# Patient Record
Sex: Female | Born: 1965 | Race: White | Hispanic: No | Marital: Single | Smoking: Current every day smoker
Health system: Southern US, Community
[De-identification: ages and names within clinical notes are randomized; demographics above are authoritative.]

## PROBLEM LIST (undated history)

## (undated) DIAGNOSIS — F32A Depression, unspecified: Secondary | ICD-10-CM

## (undated) DIAGNOSIS — E785 Hyperlipidemia, unspecified: Secondary | ICD-10-CM

## (undated) DIAGNOSIS — F411 Generalized anxiety disorder: Secondary | ICD-10-CM

## (undated) DIAGNOSIS — F329 Major depressive disorder, single episode, unspecified: Secondary | ICD-10-CM

## (undated) DIAGNOSIS — I10 Essential (primary) hypertension: Secondary | ICD-10-CM

## (undated) HISTORY — DX: Depression, unspecified: F32.A

## (undated) HISTORY — DX: Major depressive disorder, single episode, unspecified: F32.9

## (undated) HISTORY — DX: Generalized anxiety disorder: F41.1

## (undated) HISTORY — DX: Hyperlipidemia, unspecified: E78.5

## (undated) HISTORY — DX: Essential (primary) hypertension: I10

## (undated) HISTORY — PX: LEG SURGERY: SHX1003

---

## 2005-07-03 ENCOUNTER — Other Ambulatory Visit: Admission: RE | Admit: 2005-07-03 | Discharge: 2005-07-03 | Payer: Self-pay | Admitting: Family Medicine

## 2012-08-18 ENCOUNTER — Encounter: Payer: Self-pay | Admitting: Nurse Practitioner

## 2012-08-18 ENCOUNTER — Ambulatory Visit (INDEPENDENT_AMBULATORY_CARE_PROVIDER_SITE_OTHER): Payer: BC Managed Care – PPO | Admitting: Nurse Practitioner

## 2012-08-18 VITALS — BP 145/87 | HR 69 | Temp 98.2°F | Ht 63.0 in | Wt 139.0 lb

## 2012-08-18 DIAGNOSIS — Z13220 Encounter for screening for lipoid disorders: Secondary | ICD-10-CM

## 2012-08-18 DIAGNOSIS — M542 Cervicalgia: Secondary | ICD-10-CM

## 2012-08-18 DIAGNOSIS — F329 Major depressive disorder, single episode, unspecified: Secondary | ICD-10-CM

## 2012-08-18 DIAGNOSIS — F32A Depression, unspecified: Secondary | ICD-10-CM | POA: Insufficient documentation

## 2012-08-18 MED ORDER — CITALOPRAM HYDROBROMIDE 20 MG PO TABS: 20.0000 mg | ORAL_TABLET | Freq: Every day | ORAL | Status: DC

## 2012-08-18 MED ORDER — CYCLOBENZAPRINE HCL 10 MG PO TABS: 10.0000 mg | ORAL_TABLET | Freq: Three times a day (TID) | ORAL | Status: DC | PRN

## 2012-08-18 NOTE — Progress Notes (Signed)
  Subjective:    Patient ID: Maria West, female    DOB: 1965-12-18, 47 y.o.   MRN: [id number]  HPI Patient here to day for follow-up of depression. She has been on celexa 20mg  QD and states that she is doing well. If forgets to take it she can tell a difference. No c/o side effects from medication.  8 Patient had some neck pain last week. Flares up occasionally. Uses muscle relaxer's when flares up. Is currently out of meds.   Review of Systems  Constitutional: Negative.   HENT: Negative.   Respiratory: Negative.   Cardiovascular: Negative.   Musculoskeletal: Positive for myalgias (neck intermittently).  Psychiatric/Behavioral: Negative.        Objective:   Physical Exam  Constitutional: She appears well-developed and well-nourished.  Cardiovascular: Normal rate, regular rhythm and normal heart sounds.   Pulmonary/Chest: Effort normal and breath sounds normal.  Musculoskeletal:  FROM of neck without  Motor strength and sensation bil upper ext intact Grips =Bil  Psychiatric: She has a normal mood and affect. Her behavior is normal. Judgment and thought content normal.   BP 145/87  Pulse 69  Temp(Src) 98.2 F (36.8 C) (Oral)  Ht 5\' 3"  (1.6 m)  Wt 139 lb (63.05 kg)  BMI 24.63 kg/m2  LMP 08/18/2009        Assessment & Plan:  1. Depression Stress management exercise - citalopram (CELEXA) 20 MG tablet; Take 1 tablet (20 mg total) by mouth daily.  Dispense: 90 tablet; Refill: 1  2. Neck pain, bilateral Neck stretches q AM Moist heat Get new pillow - cyclobenzaprine (FLEXERIL) 10 MG tablet; Take 1 tablet (10 mg total) by mouth 3 (three) times daily as needed for muscle spasms.  Dispense: 30 tablet; Refill: 0 - COMPLETE METABOLIC PANEL WITH GFR  3. Screening cholesterol level Low fat diet and execise - NMR Lipoprofile with Lipids  Mary-Margaret Daphine Deutscher, FNP

## 2012-08-18 NOTE — Patient Instructions (Signed)
Moist heat to neck New pillow meds as rx

## 2012-08-19 LAB — NMR LIPOPROFILE WITH LIPIDS
Cholesterol, Total: 256 mg/dL — ABNORMAL HIGH (ref <200)
HDL Size: 9.5 nm (ref >=9.2)
Large HDL-P: 9.5 umol/L (ref >=4.8)
Large VLDL-P: 4.7 nmol/L — ABNORMAL HIGH (ref <=2.7)
Small LDL Particle Number: 630 nmol/L — ABNORMAL HIGH (ref <=527)
Triglycerides: 118 mg/dL (ref <150)

## 2012-08-19 LAB — COMPLETE METABOLIC PANEL WITH GFR
Albumin: 4.7 g/dL (ref 3.5–5.2)
Alkaline Phosphatase: 60 U/L (ref 39–117)
BUN: 11 mg/dL (ref 6–23)
Calcium: 10.1 mg/dL (ref 8.4–10.5)
Chloride: 103 mEq/L (ref 96–112)
GFR, Est Non African American: >89 mL/min
Glucose, Bld: 78 mg/dL (ref 70–99)
Potassium: 4.3 mEq/L (ref 3.5–5.3)
Sodium: 138 mEq/L (ref 135–145)
Total Protein: 6.9 g/dL (ref 6.0–8.3)

## 2012-08-20 ENCOUNTER — Telehealth: Payer: Self-pay | Admitting: Nurse Practitioner

## 2012-08-20 NOTE — Telephone Encounter (Signed)
Patient aware of labs.  

## 2012-09-30 ENCOUNTER — Ambulatory Visit (INDEPENDENT_AMBULATORY_CARE_PROVIDER_SITE_OTHER): Payer: BC Managed Care – PPO | Admitting: Family Medicine

## 2012-09-30 ENCOUNTER — Encounter: Payer: Self-pay | Admitting: Family Medicine

## 2012-09-30 ENCOUNTER — Ambulatory Visit (INDEPENDENT_AMBULATORY_CARE_PROVIDER_SITE_OTHER): Payer: BC Managed Care – PPO

## 2012-09-30 VITALS — BP 142/85 | HR 61 | Temp 97.6°F | Wt 135.8 lb

## 2012-09-30 DIAGNOSIS — M5412 Radiculopathy, cervical region: Secondary | ICD-10-CM | POA: Insufficient documentation

## 2012-09-30 MED ORDER — PREDNISONE 20 MG PO TABS: 40.0000 mg | ORAL_TABLET | Freq: Every day | ORAL | Status: DC

## 2012-09-30 NOTE — Progress Notes (Signed)
Patient ID: Maha Fischel, female   DOB: 12-Jan-1966, 47 y.o.   MRN: [id number] SUBJECTIVE: CC: Chief Complaint  Patient presents with  . Acute Visit    rt arm tingling hx tendonitits .  ongoing x 3 week     HPI: 6 weeks ago seen for neck pain by [facility] and that got better and is fine now. Days before it went away she had a shooting pain down the right shoulder. Now mainly intermittent shooting pain and tingling down the right arm and elbow and hand. No weakness, the right arm feels tight.  PMH/PSH: reviewed/updated in Epic  SH/FH: reviewed/updated in Epic  Allergies: reviewed/updated in Epic  Medications: reviewed/updated in Epic  Immunizations: reviewed/updated in Epic  ROS: As above in the HPI. All other systems are stable or negative.  OBJECTIVE: APPEARANCE:  Patient in no acute distress.The patient appeared well nourished and normally developed. Acyanotic. Waist: VITAL SIGNS:BP 142/85  Pulse 61  Temp(Src) 97.6 F (36.4 C) (Oral)  Wt 135 lb 12.8 oz (61.598 kg)  BMI 24.06 kg/m2  LMP 08/18/2009 WF  SKIN: warm and  Dry without overt rashes, tattoos and scars  HEAD and Neck: without JVD, Head and scalp: normal Eyes:No scleral icterus. Fundi normal, eye movements normal. Ears: Auricle normal, canal normal, Tympanic membranes normal, insufflation normal. Nose: normal Throat: normal Neck : FROM of the neck. thyroid: normal  CHEST & LUNGS: Chest wall: normal Lungs: Clear  CVS: Reveals the PMI to be normally located. Regular rhythm, First and Second Heart sounds are normal,  absence of murmurs, rubs or gallops. Peripheral vasculature: Radial pulses: normal Dorsal pedis pulses: normal Posterior pulses: normal  ABDOMEN:  Appearance: normal Benign, no organomegaly, no masses, no Abdominal Aortic enlargement. No Guarding , no rebound. No Bruits. Bowel sounds: normal  RECTAL: N/A GU: N/A  EXTREMETIES: nonedematous. Both Femoral and Pedal pulses  are normal.  MUSCULOSKELETAL:  Spine: normal Joints: intact  NEUROLOGIC: oriented to time,place and person; nonfocal. Strength is normal Sensory is normal Reflexes are normal Cranial Nerves are normal. Results for orders placed in visit on 08/18/12  COMPLETE METABOLIC PANEL WITH GFR      Result Value Range   Sodium 138  135 - 145 mEq/L   Potassium 4.3  3.5 - 5.3 mEq/L   Chloride 103  96 - 112 mEq/L   CO2 27  19 - 32 mEq/L   Glucose, Bld 78  70 - 99 mg/dL   BUN 11  6 - 23 mg/dL   Creat 4.09  8.11 - 9.14 mg/dL   Total Bilirubin 0.4  0.3 - 1.2 mg/dL   Alkaline Phosphatase 60  39 - 117 U/L   AST 18  0 - 37 U/L   ALT 21  0 - 35 U/L   Total Protein 6.9  6.0 - 8.3 g/dL   Albumin 4.7  3.5 - 5.2 g/dL   Calcium 78.2  8.4 - 95.6 mg/dL   GFR, Est African American >89     GFR, Est Non African American >89    NMR LIPOPROFILE WITH LIPIDS      Result Value Range   LDL Particle Number 1899 (*) <1000 nmol/L   LDL (calc) 159 (*) <100 mg/dL   HDL-C 73  >=21 mg/dL   Triglycerides 308  <657 mg/dL   Cholesterol, Total 846 (*) <200 mg/dL   HDL Particle Number 96.2  >=95.2 umol/L   Large HDL-P 9.5  >=4.8 umol/L   Large VLDL-P 4.7 (*) <=2.7 nmol/L  Small LDL Particle Number 630 (*) <=527 nmol/L   LDL Size 21.4  >20.5 nm   HDL Size 9.5  >=9.2 nm   VLDL Size 46.6  <=46.6 nm   LP-IR Score 36  <=45    ASSESSMENT: Cervical radiculopathy - Plan: DG Cervical Spine Complete, predniSONE (DELTASONE) 20 MG tablet   PLAN: [facility] reading (PRIMARY) by  Dr.Giara Mcgaughey: reversal of cervical spine curvature with loss of space C4-C5, and C5-C6. DDD. Await over read.  Orders Placed This Encounter  Procedures  . DG Cervical Spine Complete    Standing Status: Future     Number of Occurrences: 1     Standing Expiration Date: 11/30/2013    Order Specific Question:  Reason for Exam (SYMPTOM  OR DIAGNOSIS REQUIRED)    Answer:  cervical radicular pain    Order Specific Question:  Is the patient pregnant?     Answer:  No    Order Specific Question:  Preferred imaging location?    Answer:  Internal   Meds ordered this encounter  Medications  . predniSONE (DELTASONE) 20 MG tablet    Sig: Take 2 tablets (40 mg total) by mouth daily. For 3 days the 1 tab daily for 3 days the 1/2 tab daily for 2 days.    Dispense:  10 tablet    Refill:  0    If symptoms do not resolve patient to call and will set up PT. If PT does not work then consider MRI and N/S consult.  Return if symptoms worsen or fail to improve.  Harmani Neto P. Modesto Charon, M.D.

## 2012-09-30 NOTE — Patient Instructions (Signed)
Smoking Cessation Quitting smoking is important to your health and has many advantages. However, it is not always easy to quit since nicotine is a very addictive drug. Often times, people try 3 times or more before being able to quit. This document explains the best ways for you to prepare to quit smoking. Quitting takes hard work and a lot of effort, but you can do it. ADVANTAGES OF QUITTING SMOKING  You will live longer, feel better, and live better.  Your body will feel the impact of quitting smoking almost immediately.  Within 20 minutes, blood pressure decreases. Your pulse returns to its normal level.  After 8 hours, carbon monoxide levels in the blood return to normal. Your oxygen level increases.  After 24 hours, the chance of having a heart attack starts to decrease. Your breath, hair, and body stop smelling like smoke.  After 48 hours, damaged nerve endings begin to recover. Your sense of taste and smell improve.  After 72 hours, the body is virtually free of nicotine. Your bronchial tubes relax and breathing becomes easier.  After 2 to 12 weeks, lungs can hold more air. Exercise becomes easier and circulation improves.  The risk of having a heart attack, stroke, cancer, or lung disease is greatly reduced.  After 1 year, the risk of coronary heart disease is cut in half.  After 5 years, the risk of stroke falls to the same as a nonsmoker.  After 10 years, the risk of lung cancer is cut in half and the risk of other cancers decreases significantly.  After 15 years, the risk of coronary heart disease drops, usually to the level of a nonsmoker.  If you are pregnant, quitting smoking will improve your chances of having a healthy baby.  The people you live with, especially any children, will be healthier.  You will have extra money to spend on things other than cigarettes. QUESTIONS TO THINK ABOUT BEFORE ATTEMPTING TO QUIT You may want to talk about your answers with your  caregiver.  Why do you want to quit?  If you tried to quit in the past, what helped and what did not?  What will be the most difficult situations for you after you quit? How will you plan to handle them?  Who can help you through the tough times? Your family? Friends? A caregiver?  What pleasures do you get from smoking? What ways can you still get pleasure if you quit? Here are some questions to ask your caregiver:  How can you help me to be successful at quitting?  What medicine do you think would be best for me and how should I take it?  What should I do if I need more help?  What is smoking withdrawal like? How can I get information on withdrawal? GET READY  Set a quit date.  Change your environment by getting rid of all cigarettes, ashtrays, matches, and lighters in your home, car, or work. Do not let people smoke in your home.  Review your past attempts to quit. Think about what worked and what did not. GET SUPPORT AND ENCOURAGEMENT You have a better chance of being successful if you have help. You can get support in many ways.  Tell your family, friends, and co-workers that you are going to quit and need their support. Ask them not to smoke around you.  Get individual, group, or telephone counseling and support. Programs are available at local hospitals and health centers. Call your local health department for   information about programs in your area.  Spiritual beliefs and practices may help some smokers quit.  Download a "quit meter" on your computer to keep track of quit statistics, such as how long you have gone without smoking, cigarettes not smoked, and money saved.  Get a self-help book about quitting smoking and staying off of tobacco. LEARN NEW SKILLS AND BEHAVIORS  Distract yourself from urges to smoke. Talk to someone, go for a walk, or occupy your time with a task.  Change your normal routine. Take a different route to work. Drink tea instead of coffee.  Eat breakfast in a different place.  Reduce your stress. Take a hot bath, exercise, or read a book.  Plan something enjoyable to do every day. Reward yourself for not smoking.  Explore interactive web-based programs that specialize in helping you quit. GET MEDICINE AND USE IT CORRECTLY Medicines can help you stop smoking and decrease the urge to smoke. Combining medicine with the above behavioral methods and support can greatly increase your chances of successfully quitting smoking.  Nicotine replacement therapy helps deliver nicotine to your body without the negative effects and risks of smoking. Nicotine replacement therapy includes nicotine gum, lozenges, inhalers, nasal sprays, and skin patches. Some may be available over-the-counter and others require a prescription.  Antidepressant medicine helps people abstain from smoking, but how this works is unknown. This medicine is available by prescription.  Nicotinic receptor partial agonist medicine simulates the effect of nicotine in your brain. This medicine is available by prescription. Ask your caregiver for advice about which medicines to use and how to use them based on your health history. Your caregiver will tell you what side effects to look out for if you choose to be on a medicine or therapy. Carefully read the information on the package. Do not use any other product containing nicotine while using a nicotine replacement product.  RELAPSE OR DIFFICULT SITUATIONS Most relapses occur within the first 3 months after quitting. Do not be discouraged if you start smoking again. Remember, most people try several times before finally quitting. You may have symptoms of withdrawal because your body is used to nicotine. You may crave cigarettes, be irritable, feel very hungry, cough often, get headaches, or have difficulty concentrating. The withdrawal symptoms are only temporary. They are strongest when you first quit, but they will go away within  10 14 days. To reduce the chances of relapse, try to:  Avoid drinking alcohol. Drinking lowers your chances of successfully quitting.  Reduce the amount of caffeine you consume. Once you quit smoking, the amount of caffeine in your body increases and can give you symptoms, such as a rapid heartbeat, sweating, and anxiety.  Avoid smokers because they can make you want to smoke.  Do not let weight gain distract you. Many smokers will gain weight when they quit, usually less than 10 pounds. Eat a healthy diet and stay active. You can always lose the weight gained after you quit.  Find ways to improve your mood other than smoking. FOR MORE INFORMATION  www.smokefree.gov  Document Released: 04/03/2001 Document Revised: 10/09/2011 Document Reviewed: 07/19/2011 [employer] Patient Information 2014 [employer].  

## 2013-02-26 ENCOUNTER — Other Ambulatory Visit: Payer: Self-pay

## 2013-03-11 ENCOUNTER — Other Ambulatory Visit: Payer: Self-pay | Admitting: *Deleted

## 2013-03-11 DIAGNOSIS — F32A Depression, unspecified: Secondary | ICD-10-CM

## 2013-03-11 DIAGNOSIS — F329 Major depressive disorder, single episode, unspecified: Secondary | ICD-10-CM

## 2013-03-11 MED ORDER — CITALOPRAM HYDROBROMIDE 20 MG PO TABS: 20.0000 mg | ORAL_TABLET | Freq: Every day | ORAL | Status: DC

## 2013-03-11 NOTE — Telephone Encounter (Signed)
LAST OV 09/30/12.

## 2013-03-31 ENCOUNTER — Ambulatory Visit (INDEPENDENT_AMBULATORY_CARE_PROVIDER_SITE_OTHER): Payer: BC Managed Care – PPO | Admitting: Nurse Practitioner

## 2013-03-31 ENCOUNTER — Encounter: Payer: Self-pay | Admitting: Nurse Practitioner

## 2013-03-31 VITALS — BP 140/79 | HR 79 | Temp 99.4°F | Ht 63.0 in | Wt 148.0 lb

## 2013-03-31 DIAGNOSIS — F329 Major depressive disorder, single episode, unspecified: Secondary | ICD-10-CM

## 2013-03-31 DIAGNOSIS — F32A Depression, unspecified: Secondary | ICD-10-CM

## 2013-03-31 MED ORDER — CITALOPRAM HYDROBROMIDE 20 MG PO TABS: 20.0000 mg | ORAL_TABLET | Freq: Every day | ORAL | Status: DC

## 2013-03-31 NOTE — Progress Notes (Signed)
   Subjective:    Patient ID: Maria West, female    DOB: 10-15-1965, 47 y.o.   MRN: [id number]  HPI Patient here today for follow up of depression- SHe is on celexa 20mg  and she has actually reduced her dose to 10 mg daily an dis doing well- says that she has lees stress at work and home and does not  Feel like she needs as much. Nno side effects    Review of Systems  Constitutional: Negative.   HENT: Negative.   Respiratory: Negative.   Cardiovascular: Negative.   Gastrointestinal: Negative.   Genitourinary: Negative.   Neurological: Negative.   Hematological: Negative.   Psychiatric/Behavioral: Negative.   All other systems reviewed and are negative.       Objective:   Physical Exam  Constitutional: She is oriented to person, place, and time. She appears well-developed and well-nourished.  Cardiovascular: Normal rate, regular rhythm and normal heart sounds.   Pulmonary/Chest: Effort normal and breath sounds normal.  Neurological: She is alert and oriented to person, place, and time. She has normal reflexes.  Skin: Skin is warm.  Psychiatric: She has a normal mood and affect. Her behavior is normal. Judgment and thought content normal.    BP 140/79  Pulse 79  Temp(Src) 99.4 F (37.4 C) (Oral)  Ht 5\' 3"  (1.6 m)  Wt 148 lb (67.132 kg)  BMI 26.22 kg/m2  LMP 08/18/2009       Assessment & Plan:   1. Depression     Meds ordered this encounter  Medications  . citalopram (CELEXA) 20 MG tablet    Sig: Take 1 tablet (20 mg total) by mouth daily.    Dispense:  90 tablet    Refill:  1    Order Specific Question:  Supervising Provider    Answer:  Ernestina Penna [[id number]]   Patient will schdule PAP in next 3 months Will schedule own mammogram Continue all meds Labs pending Diet and exercise encouraged Health maintenance reviewed Follow up prn  Maria Daphine Deutscher, FNP

## 2013-03-31 NOTE — Patient Instructions (Signed)

## 2013-06-17 ENCOUNTER — Ambulatory Visit (INDEPENDENT_AMBULATORY_CARE_PROVIDER_SITE_OTHER): Payer: BC Managed Care – PPO | Admitting: Family Medicine

## 2013-06-17 ENCOUNTER — Encounter: Payer: Self-pay | Admitting: Family Medicine

## 2013-06-17 VITALS — BP 133/73 | HR 68 | Temp 98.3°F | Ht 63.0 in | Wt 149.7 lb

## 2013-06-17 DIAGNOSIS — N390 Urinary tract infection, site not specified: Secondary | ICD-10-CM

## 2013-06-17 DIAGNOSIS — R52 Pain, unspecified: Secondary | ICD-10-CM

## 2013-06-17 DIAGNOSIS — M542 Cervicalgia: Secondary | ICD-10-CM

## 2013-06-17 DIAGNOSIS — R109 Unspecified abdominal pain: Secondary | ICD-10-CM

## 2013-06-17 LAB — POCT URINALYSIS DIPSTICK
Bilirubin, UA: NEGATIVE
Glucose, UA: NEGATIVE
Ketones, UA: NEGATIVE
Nitrite, UA: NEGATIVE
Protein, UA: NEGATIVE
Spec Grav, UA: 1.015
Urobilinogen, UA: NEGATIVE
pH, UA: 5

## 2013-06-17 LAB — POCT UA - MICROSCOPIC ONLY
Casts, Ur, LPF, POC: NEGATIVE
Crystals, Ur, HPF, POC: NEGATIVE
Mucus, UA: NEGATIVE
Yeast, UA: NEGATIVE

## 2013-06-17 MED ORDER — NAPROXEN 500 MG PO TABS: 500.0000 mg | ORAL_TABLET | Freq: Two times a day (BID) | ORAL | Status: DC

## 2013-06-17 MED ORDER — CIPROFLOXACIN HCL 500 MG PO TABS: 500.0000 mg | ORAL_TABLET | Freq: Two times a day (BID) | ORAL | Status: DC

## 2013-06-17 MED ORDER — CYCLOBENZAPRINE HCL 10 MG PO TABS: 10.0000 mg | ORAL_TABLET | Freq: Three times a day (TID) | ORAL | Status: DC | PRN

## 2013-06-17 NOTE — Progress Notes (Signed)
   Subjective:    Patient ID: Dollene PrimroseVanessa Fredin, female    DOB: 11-03-1965, 48 y.o.   MRN: [id number]  HPI This 48 y.o. female presents for evaluation of back pain for 2 days.  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   Review of Systems No chest pain, SOB, HA, dizziness, vision change, N/V, diarrhea, constipation, dysuria, urinary urgency or frequency, myalgias, arthralgias or rash.     Objective:   Physical Exam  Vital signs noted  Well developed well nourished female.  HEENT - Head atraumatic Normocephalic                Eyes - PERRLA, Conjuctiva - clear Sclera- Clear EOMI                Ears - EAC's Wnl TM's Wnl Gross Hearing WNL                Nose - Nares patent                 Throat - oropharanx wnl Respiratory - Lungs CTA bilateral Cardiac - RRR S1 and S2 without murmur GI - Abdomen soft Nontender and bowel sounds active x 4 Extremities - No edema. Neuro - Grossly intact. MS - TTP right lumbar muscle  Results for orders placed in visit on 06/17/13  POCT UA - MICROSCOPIC ONLY      Result Value Ref Range   WBC, Ur, HPF, POC 5-8     RBC, urine, microscopic 10-12     Bacteria, U Microscopic few     Mucus, UA negative     Epithelial cells, urine per micros few     Crystals, Ur, HPF, POC negative     Casts, Ur, LPF, POC negative     Yeast, UA negative    POCT URINALYSIS DIPSTICK      Result Value Ref Range   Color, UA amber     Clarity, UA clear     Glucose, UA negative     Bilirubin, UA negative     Ketones, UA negative     Spec Grav, UA 1.015     Blood, UA moderate     pH, UA 5.0     Protein, UA negative     Urobilinogen, UA negative     Nitrite, UA negative     Leukocytes, UA Trace         Assessment & Plan:  Acute flank pain - Plan: POCT UA - Microscopic Only, POCT urinalysis dipstick, naproxen (NAPROSYN) 500 MG tablet, cyclobenzaprine (FLEXERIL) 10 MG tablet   UTI (lower urinary tract infection) - Plan:  ciprofloxacin (CIPRO) 500 MG tablet bid x 10 days  Deatra CanterWilliam J Ellouise Mcwhirter FNP

## 2014-01-12 ENCOUNTER — Ambulatory Visit (INDEPENDENT_AMBULATORY_CARE_PROVIDER_SITE_OTHER): Payer: BC Managed Care – PPO | Admitting: Family

## 2014-01-12 ENCOUNTER — Encounter: Payer: Self-pay | Admitting: Family

## 2014-01-12 VITALS — BP 139/79 | HR 66 | Temp 97.8°F | Ht 63.0 in | Wt 145.8 lb

## 2014-01-12 DIAGNOSIS — Z124 Encounter for screening for malignant neoplasm of cervix: Secondary | ICD-10-CM

## 2014-01-12 DIAGNOSIS — Z01419 Encounter for gynecological examination (general) (routine) without abnormal findings: Secondary | ICD-10-CM

## 2014-01-12 DIAGNOSIS — Z0000 Encounter for general adult medical examination without abnormal findings: Secondary | ICD-10-CM

## 2014-01-12 DIAGNOSIS — F32A Depression, unspecified: Secondary | ICD-10-CM

## 2014-01-12 DIAGNOSIS — F329 Major depressive disorder, single episode, unspecified: Secondary | ICD-10-CM

## 2014-01-12 LAB — POCT CBC
Granulocyte percent: 66.3 %G (ref 37–80)
HCT, POC: 45.4 % (ref 37.7–47.9)
HEMOGLOBIN: 15 g/dL (ref 12.2–16.2)
Lymph, poc: 1.4 (ref 0.6–3.4)
MCH, POC: 31.7 pg — AB (ref 27–31.2)
MCHC: 33.1 g/dL (ref 31.8–35.4)
MCV: 95.8 fL (ref 80–97)
MPV: 9 fL (ref 0–99.8)
PLATELET COUNT, POC: 175 10*3/uL (ref 142–424)
POC GRANULOCYTE: 3.1 (ref 2–6.9)
POC LYMPH PERCENT: 29.1 %L (ref 10–50)
RBC: 4.7 M/uL (ref 4.04–5.48)
RDW, POC: 13.2 %
WBC: 4.7 10*3/uL (ref 4.6–10.2)

## 2014-01-12 LAB — POCT UA - MICROSCOPIC ONLY
CASTS, UR, LPF, POC: NEGATIVE
CRYSTALS, UR, HPF, POC: NEGATIVE
Mucus, UA: NEGATIVE
YEAST UA: NEGATIVE

## 2014-01-12 LAB — POCT URINALYSIS DIPSTICK
Bilirubin, UA: NEGATIVE
Glucose, UA: NEGATIVE
Ketones, UA: NEGATIVE
Nitrite, UA: NEGATIVE
PH UA: 5
Protein, UA: NEGATIVE
Spec Grav, UA: 1.01
UROBILINOGEN UA: NEGATIVE

## 2014-01-12 MED ORDER — CITALOPRAM HYDROBROMIDE 20 MG PO TABS: 20.0000 mg | ORAL_TABLET | Freq: Every day | ORAL | Status: DC

## 2014-01-12 NOTE — Progress Notes (Signed)
   Subjective:    Patient ID: Maria West, female    DOB: 08-Dec-1965, 48 y.o.   MRN: [id number]  HPI Pt presents to the office for annual with pap. Pt currently taking medication for depression which she states is well controlled and has no complaints at this time. Pt denies palpitations,  SOB, or edema. Pt states she has chronic lower back pain that she takes flexeril as needed.    Review of Systems  Constitutional: Negative.   HENT: Negative.   Eyes: Negative.   Respiratory: Negative.  Negative for shortness of breath.   Cardiovascular: Negative.  Negative for palpitations.  Gastrointestinal: Negative.   Endocrine: Negative.   Genitourinary: Negative.   Musculoskeletal: Positive for back pain.  Neurological: Negative.  Negative for headaches.  Hematological: Negative.   Psychiatric/Behavioral: Negative.   All other systems reviewed and are negative.      Objective:   Physical Exam  Vitals reviewed. Constitutional: She is oriented to person, place, and time. She appears well-developed and well-nourished. No distress.  HENT:  Head: Normocephalic and atraumatic.  Right Ear: External ear normal.  Left Ear: External ear normal.  Nose: Nose normal.  Mouth/Throat: Oropharynx is clear and moist.  Eyes: Pupils are equal, round, and reactive to light.  Neck: Normal range of motion. Neck supple. No thyromegaly present.  Cardiovascular: Normal rate, regular rhythm, normal heart sounds and intact distal pulses.   No murmur heard. Pulmonary/Chest: Effort normal and breath sounds normal. No respiratory distress. She has no wheezes.  Abdominal: Soft. Bowel sounds are normal. She exhibits no distension. There is no tenderness.  Musculoskeletal: Normal range of motion. She exhibits no edema and no tenderness.  Neurological: She is alert and oriented to person, place, and time. She has normal reflexes. No cranial nerve deficit.  Skin: Skin is warm and dry.  Psychiatric: She has a normal  mood and affect. Her behavior is normal. Judgment and thought content normal.    BP 139/79  Pulse 66  Temp(Src) 97.8 F (36.6 C) (Oral)  Ht $R'5\' 3"'rO$  (1.6 m)  Wt 145 lb 12.8 oz (66.134 kg)  BMI 25.83 kg/m2  LMP 08/18/2009       Assessment & Plan:  1. Annual physical exam - POCT urinalysis dipstick - POCT UA - Microscopic Only - CMP14+EGFR - Lipid panel - POCT CBC - Thyroid Panel With TSH - Vit D  25 hydroxy (rtn osteoporosis monitoring)  2. Depression - CMP14+EGFR - citalopram (CELEXA) 20 MG tablet; Take 1 tablet (20 mg total) by mouth daily.  Dispense: 90 tablet; Refill: 4  3. Encounter for routine gynecological examination - Pap IG w/ reflex to HPV when ASC-U   Continue all meds Labs pending Health Maintenance reviewed Diet and exercise encouraged RTO 1 year  Evelina Dun, FNP

## 2014-01-12 NOTE — Patient Instructions (Signed)

## 2014-01-13 LAB — LIPID PANEL
CHOLESTEROL TOTAL: 319 mg/dL — AB (ref 100–199)
Chol/HDL Ratio: 3.8 ratio units (ref 0.0–4.4)
HDL: 84 mg/dL (ref >39)
LDL Calculated: 220 mg/dL — ABNORMAL HIGH (ref 0–99)
Triglycerides: 74 mg/dL (ref 0–149)
VLDL Cholesterol Cal: 15 mg/dL (ref 5–40)

## 2014-01-13 LAB — CMP14+EGFR
ALT: 15 IU/L (ref 0–32)
AST: 20 IU/L (ref 0–40)
Albumin/Globulin Ratio: 2.2 (ref 1.1–2.5)
Albumin: 4.8 g/dL (ref 3.5–5.5)
Alkaline Phosphatase: 79 IU/L (ref 39–117)
BUN/Creatinine Ratio: 18 (ref 9–23)
BUN: 13 mg/dL (ref 6–24)
CO2: 22 mmol/L (ref 18–29)
Calcium: 9.6 mg/dL (ref 8.7–10.2)
Chloride: 102 mmol/L (ref 97–108)
Creatinine, Ser: 0.71 mg/dL (ref 0.57–1.00)
GFR calc Af Amer: 118 mL/min/{1.73_m2} (ref >59)
GFR calc non Af Amer: 102 mL/min/{1.73_m2} (ref >59)
GLUCOSE: 99 mg/dL (ref 65–99)
Globulin, Total: 2.2 g/dL (ref 1.5–4.5)
POTASSIUM: 4.9 mmol/L (ref 3.5–5.2)
SODIUM: 142 mmol/L (ref 134–144)
TOTAL PROTEIN: 7 g/dL (ref 6.0–8.5)
Total Bilirubin: 0.2 mg/dL (ref 0.0–1.2)

## 2014-01-13 LAB — VITAMIN D 25 HYDROXY (VIT D DEFICIENCY, FRACTURES): VIT D 25 HYDROXY: 24.8 ng/mL — AB (ref 30.0–100.0)

## 2014-01-13 LAB — THYROID PANEL WITH TSH
FREE THYROXINE INDEX: 1.7 (ref 1.2–4.9)
T3 Uptake Ratio: 33 % (ref 24–39)
T4, Total: 5.3 ug/dL (ref 4.5–12.0)
TSH: 0.678 u[IU]/mL (ref 0.450–4.500)

## 2014-01-14 ENCOUNTER — Other Ambulatory Visit: Payer: Self-pay | Admitting: Family

## 2014-01-14 LAB — PAP IG W/ RFLX HPV ASCU: PAP Smear Comment: 0

## 2014-01-14 MED ORDER — VITAMIN D (ERGOCALCIFEROL) 1.25 MG (50000 UNIT) PO CAPS: 50000.0000 [IU] | ORAL_CAPSULE | ORAL | Status: DC

## 2014-01-14 MED ORDER — SIMVASTATIN 40 MG PO TABS: 40.0000 mg | ORAL_TABLET | Freq: Every day | ORAL | Status: DC

## 2014-01-18 ENCOUNTER — Telehealth: Payer: Self-pay | Admitting: Family Medicine

## 2014-01-18 NOTE — Telephone Encounter (Signed)
Message copied by Azalee Course on Mon Jan 18, 2014  9:51 AM ------      Message from: Lendon Colonel, [place]      Created: Thu Jan 14, 2014  4:21 PM       CBC (WBC, HGB, & Plts) WNL      Urine negative for UTI      Kidney and liver function stable      Cholesterol levels elevated- RX sent to pharmacy      Thyroid levels WNL      Vit D levels low- RX sent to pharmacy      Pap negative for lesion or malignancy ------

## 2014-10-18 ENCOUNTER — Encounter: Payer: Self-pay | Admitting: Family

## 2014-10-18 ENCOUNTER — Ambulatory Visit (INDEPENDENT_AMBULATORY_CARE_PROVIDER_SITE_OTHER): Payer: 59 | Admitting: Family

## 2014-10-18 DIAGNOSIS — S46911A Strain of unspecified muscle, fascia and tendon at shoulder and upper arm level, right arm, initial encounter: Secondary | ICD-10-CM

## 2014-10-18 DIAGNOSIS — M25511 Pain in right shoulder: Secondary | ICD-10-CM | POA: Diagnosis not present

## 2014-10-18 MED ORDER — KETOROLAC TROMETHAMINE 60 MG/2ML IM SOLN
60.0000 mg | Freq: Once | INTRAMUSCULAR | Status: AC
  Administered 2014-10-18: 60 mg via INTRAMUSCULAR

## 2014-10-18 MED ORDER — MELOXICAM 15 MG PO TABS: 15.0000 mg | ORAL_TABLET | Freq: Every day | ORAL | Status: DC

## 2014-10-18 MED ORDER — METHYLPREDNISOLONE ACETATE 80 MG/ML IJ SUSP
80.0000 mg | Freq: Once | INTRAMUSCULAR | Status: AC
  Administered 2014-10-18: 80 mg via INTRAMUSCULAR

## 2014-10-18 MED ORDER — CYCLOBENZAPRINE HCL 10 MG PO TABS: 10.0000 mg | ORAL_TABLET | Freq: Three times a day (TID) | ORAL | Status: DC | PRN

## 2014-10-18 NOTE — Progress Notes (Signed)
Subjective:    Patient ID: Maria West, female    DOB: 1965/05/04, 49 y.o.   MRN: [id number]  Shoulder Pain  The pain is present in the right shoulder. This is a new problem. The current episode started 1 to 4 weeks ago. There has been no history of extremity trauma. The problem occurs constantly. The problem has been unchanged. The quality of the pain is described as aching. The pain is at a severity of 8/10. The pain is moderate. Associated symptoms include stiffness. Pertinent negatives include no inability to bear weight, joint swelling, limited range of motion, numbness or tingling. The symptoms are aggravated by activity. She has tried NSAIDS for the symptoms. The treatment provided mild relief.      Review of Systems  Constitutional: Negative.   HENT: Negative.   Eyes: Negative.   Respiratory: Negative.  Negative for shortness of breath.   Cardiovascular: Negative.  Negative for palpitations.  Gastrointestinal: Negative.   Endocrine: Negative.   Genitourinary: Negative.   Musculoskeletal: Positive for stiffness.  Neurological: Negative.  Negative for tingling, numbness and headaches.  Hematological: Negative.   Psychiatric/Behavioral: Negative.   All other systems reviewed and are negative.      Objective:   Physical Exam  Constitutional: She is oriented to person, place, and time. She appears well-developed and well-nourished. No distress.  HENT:  Head: Normocephalic and atraumatic.  Eyes: Pupils are equal, round, and reactive to light.  Neck: Normal range of motion. Neck supple. No thyromegaly present.  Cardiovascular: Normal rate, regular rhythm, normal heart sounds and intact distal pulses.   No murmur heard. Pulmonary/Chest: Effort normal and breath sounds normal. No respiratory distress. She has no wheezes.  Abdominal: Soft. Bowel sounds are normal. She exhibits no distension. There is no tenderness.  Musculoskeletal: Normal range of motion. She exhibits no  edema or tenderness.  Neurological: She is alert and oriented to person, place, and time. She has normal reflexes. No cranial nerve deficit.  Skin: Skin is warm and dry.  Psychiatric: She has a normal mood and affect. Her behavior is normal. Judgment and thought content normal.  Vitals reviewed.     BP 171/92 mmHg  Pulse 67  Temp(Src) 97.4 F (36.3 C) (Oral)  Ht 5\' 3"  (1.6 m)  Wt 148 lb (67.132 kg)  BMI 26.22 kg/m2  LMP 08/18/2009     Assessment & Plan:  1. Right shoulder pain - ketorolac (TORADOL) injection 60 mg; Inject 2 mLs (60 mg total) into the muscle once. - methylPREDNISolone acetate (DEPO-MEDROL) injection 80 mg; Inject 1 mL (80 mg total) into the muscle once. - meloxicam (MOBIC) 15 MG tablet; Take 1 tablet (15 mg total) by mouth daily.  Dispense: 30 tablet; Refill: 0 - cyclobenzaprine (FLEXERIL) 10 MG tablet; Take 1 tablet (10 mg total) by mouth 3 (three) times daily as needed for muscle spasms.  Dispense: 30 tablet; Refill: 0  2. Right shoulder strain, initial encounter -Rest -Ice -No other NSAID's while taking Mobic -Sedation precaution discussed - ketorolac (TORADOL) injection 60 mg; Inject 2 mLs (60 mg total) into the muscle once. - methylPREDNISolone acetate (DEPO-MEDROL) injection 80 mg; Inject 1 mL (80 mg total) into the muscle once. - meloxicam (MOBIC) 15 MG tablet; Take 1 tablet (15 mg total) by mouth daily.  Dispense: 30 tablet; Refill: 0 - cyclobenzaprine (FLEXERIL) 10 MG tablet; Take 1 tablet (10 mg total) by mouth 3 (three) times daily as needed for muscle spasms.  Dispense: 30 tablet; Refill: 0  *  Discussed pt's BP-Pt states she is in pain and has a lot of stress r/t to her job. Last OV pt's BP was WNL. Pt states she will monitor at home.  Jannifer Rodney, FNP

## 2014-10-18 NOTE — Patient Instructions (Signed)

## 2015-01-22 ENCOUNTER — Other Ambulatory Visit: Payer: Self-pay | Admitting: Family

## 2015-02-07 ENCOUNTER — Ambulatory Visit (INDEPENDENT_AMBULATORY_CARE_PROVIDER_SITE_OTHER): Payer: 59 | Admitting: Family

## 2015-02-07 ENCOUNTER — Encounter: Payer: Self-pay | Admitting: Family

## 2015-02-07 VITALS — BP 159/91 | HR 71 | Temp 98.2°F | Ht 63.0 in | Wt 148.6 lb

## 2015-02-07 DIAGNOSIS — M5412 Radiculopathy, cervical region: Secondary | ICD-10-CM

## 2015-02-07 DIAGNOSIS — Z0000 Encounter for general adult medical examination without abnormal findings: Secondary | ICD-10-CM

## 2015-02-07 DIAGNOSIS — E559 Vitamin D deficiency, unspecified: Secondary | ICD-10-CM | POA: Insufficient documentation

## 2015-02-07 DIAGNOSIS — N951 Menopausal and female climacteric states: Secondary | ICD-10-CM

## 2015-02-07 DIAGNOSIS — Z01419 Encounter for gynecological examination (general) (routine) without abnormal findings: Secondary | ICD-10-CM

## 2015-02-07 DIAGNOSIS — I10 Essential (primary) hypertension: Secondary | ICD-10-CM | POA: Insufficient documentation

## 2015-02-07 DIAGNOSIS — F411 Generalized anxiety disorder: Secondary | ICD-10-CM

## 2015-02-07 DIAGNOSIS — F329 Major depressive disorder, single episode, unspecified: Secondary | ICD-10-CM

## 2015-02-07 DIAGNOSIS — F32A Depression, unspecified: Secondary | ICD-10-CM

## 2015-02-07 DIAGNOSIS — E785 Hyperlipidemia, unspecified: Secondary | ICD-10-CM | POA: Insufficient documentation

## 2015-02-07 LAB — POCT UA - MICROSCOPIC ONLY
Bacteria, U Microscopic: NEGATIVE
CRYSTALS, UR, HPF, POC: NEGATIVE
Casts, Ur, LPF, POC: NEGATIVE
YEAST UA: NEGATIVE

## 2015-02-07 LAB — POCT URINALYSIS DIPSTICK
Bilirubin, UA: NEGATIVE
GLUCOSE UA: NEGATIVE
Ketones, UA: NEGATIVE
Nitrite, UA: NEGATIVE
PH UA: 6
Protein, UA: NEGATIVE
UROBILINOGEN UA: NEGATIVE

## 2015-02-07 MED ORDER — ASPIRIN EC 81 MG PO TBEC: 81.0000 mg | DELAYED_RELEASE_TABLET | Freq: Every day | ORAL | Status: DC

## 2015-02-07 MED ORDER — LISINOPRIL-HYDROCHLOROTHIAZIDE 20-12.5 MG PO TABS: 1.0000 | ORAL_TABLET | Freq: Every day | ORAL | Status: DC

## 2015-02-07 MED ORDER — SIMVASTATIN 40 MG PO TABS: 40.0000 mg | ORAL_TABLET | Freq: Every day | ORAL | Status: DC

## 2015-02-07 MED ORDER — CITALOPRAM HYDROBROMIDE 20 MG PO TABS: 20.0000 mg | ORAL_TABLET | Freq: Every day | ORAL | Status: DC

## 2015-02-07 MED ORDER — ESTROGENS, CONJUGATED 0.625 MG/GM VA CREA: 1.0000 | TOPICAL_CREAM | Freq: Every day | VAGINAL | Status: DC

## 2015-02-07 MED ORDER — ERGOCALCIFEROL 1.25 MG (50000 UT) PO CAPS: 50000.0000 [IU] | ORAL_CAPSULE | ORAL | Status: DC

## 2015-02-07 NOTE — Progress Notes (Signed)
Subjective:    Patient ID: Maria West, female    DOB: 01/30/1966, 49 y.o.   MRN: [id number]  Pt presents to the office today for CPE with pap. Pt's BP is elevated today. Pt is complaining of vaginal dryness from post menopause. Pt states she has used lubricants with mild relief. Pt denies any history of breast or endometrial cancer.  Gynecologic Exam  Hypertension This is a new problem. The current episode started more than 1 month ago. The problem is unchanged. The problem is uncontrolled. Associated symptoms include anxiety. Pertinent negatives include no palpitations, peripheral edema or shortness of breath. Risk factors for coronary artery disease include dyslipidemia, obesity, smoking/tobacco exposure and stress. Past treatments include nothing. The current treatment provides no improvement. There is no history of kidney disease, CAD/MI, CVA or a thyroid problem. There is no history of sleep apnea.  Hyperlipidemia This is a chronic problem. The current episode started more than 1 year ago. The problem is uncontrolled. Recent lipid tests were reviewed and are high. Pertinent negatives include no shortness of breath. Current antihyperlipidemic treatment includes statins. The current treatment provides moderate improvement of lipids. Risk factors for coronary artery disease include dyslipidemia, family history, hypertension, obesity and post-menopausal.  Depression      The patient presents with depression.  This is a chronic problem.  The current episode started more than 1 year ago.   The onset quality is gradual.   The problem occurs rarely.  The problem has been resolved since onset.  Associated symptoms include no helplessness, no hopelessness, does not have insomnia, not sad and no suicidal ideas.  Past treatments include SSRIs - Selective serotonin reuptake inhibitors.  Compliance with treatment is good.  Risk factors include prior psychiatric admission.   Past medical history includes  anxiety and depression.     Pertinent negatives include no thyroid problem. Anxiety Presents for follow-up visit. The problem has been waxing and waning. Symptoms include depressed mood, excessive worry and nervous/anxious behavior. Patient reports no insomnia, irritability, palpitations, shortness of breath or suicidal ideas. Symptoms occur rarely.   Her past medical history is significant for anxiety/panic attacks and depression. Past treatments include SSRIs.      Review of Systems  Constitutional: Negative.  Negative for irritability.  HENT: Negative.   Eyes: Negative.   Respiratory: Negative.  Negative for shortness of breath.   Cardiovascular: Negative.  Negative for palpitations.  Gastrointestinal: Negative.   Endocrine: Negative.   Musculoskeletal: Negative.   Neurological: Negative.   Hematological: Negative.   Psychiatric/Behavioral: Positive for depression. Negative for suicidal ideas. The patient is nervous/anxious. The patient does not have insomnia.   All other systems reviewed and are negative.      Objective:   Physical Exam  Constitutional: She is oriented to person, place, and time. She appears well-developed and well-nourished. No distress.  HENT:  Head: Normocephalic and atraumatic.  Right Ear: External ear normal.  Left Ear: External ear normal.  Nose: Nose normal.  Mouth/Throat: Oropharynx is clear and moist.  Eyes: Pupils are equal, round, and reactive to light.  Neck: Normal range of motion. Neck supple. No thyromegaly present.  Cardiovascular: Normal rate, regular rhythm, normal heart sounds and intact distal pulses.   No murmur heard. Pulmonary/Chest: Effort normal and breath sounds normal. No respiratory distress. She has no wheezes. Right breast exhibits no inverted nipple, no mass, no nipple discharge, no skin change and no tenderness. Left breast exhibits no inverted nipple, no mass, no nipple discharge,  no skin change and no tenderness. Breasts are  symmetrical.  Abdominal: Soft. Bowel sounds are normal. She exhibits no distension. There is no tenderness.  Genitourinary: Vagina normal.  Bimanual exam- no adnexal masses or tenderness, ovaries nonpalpable   Cervix parous and pink- No discharge   Musculoskeletal: Normal range of motion. She exhibits no edema or tenderness.  Neurological: She is alert and oriented to person, place, and time. She has normal reflexes. No cranial nerve deficit.  Skin: Skin is warm and dry.  Psychiatric: She has a normal mood and affect. Her behavior is normal. Judgment and thought content normal.  Vitals reviewed.     BP 159/91 mmHg  Pulse 71  Temp(Src) 98.2 F (36.8 C) (Oral)  Ht '5\' 3"'  (1.6 m)  Wt 148 lb 9.6 oz (67.405 kg)  BMI 26.33 kg/m2  LMP 08/18/2009     Assessment & Plan:  1. Depression - CMP14+EGFR - citalopram (CELEXA) 20 MG tablet; Take 1 tablet (20 mg total) by mouth daily.  Dispense: 90 tablet; Refill: 4  2. Cervical radiculopathy - CMP14+EGFR  3. Encounter for routine gynecological examination - POCT urinalysis dipstick - POCT UA - Microscopic Only - CMP14+EGFR  4. Essential hypertension -Pt started on Zestoretice 20-12.5 mg today --Daily blood pressure log given with instructions on how to fill out and told to bring to next visit -Dash diet information given -Exercise encouraged - Stress Management  -Continue current meds -RTO in 2 weeks  - CMP14+EGFR - Pap IG w/ reflex to HPV when ASC-U - lisinopril-hydrochlorothiazide (ZESTORETIC) 20-12.5 MG tablet; Take 1 tablet by mouth daily.  Dispense: 90 tablet; Refill: 3  5. Vitamin D deficiency - CMP14+EGFR - Vit D  25 hydroxy (rtn osteoporosis monitoring) - ergocalciferol (VITAMIN D2) 50000 UNITS capsule; Take 1 capsule (50,000 Units total) by mouth once a week.  Dispense: 12 capsule; Refill: 3  6. Hyperlipidemia - CMP14+EGFR - Lipid panel - simvastatin (ZOCOR) 40 MG tablet; Take 1 tablet (40 mg total) by mouth at  bedtime.  Dispense: 90 tablet; Refill: 3  7. Annual physical exam - Anemia Profile B - CMP14+EGFR - Lipid panel - Thyroid Panel With TSH - Vit D  25 hydroxy (rtn osteoporosis monitoring) - Pap IG w/ reflex to HPV when ASC-U  8. GAD (generalized anxiety disorder) - CMP14+EGFR - citalopram (CELEXA) 20 MG tablet; Take 1 tablet (20 mg total) by mouth daily.  Dispense: 90 tablet; Refill: 4  9. Vaginal dryness, menopausal -Pt stared on Premarin cream today Told pt to use 3 times a week - conjugated estrogens (PREMARIN) vaginal cream; Place 1 Applicatorful vaginally daily.  Dispense: 42.5 g; Refill: 12   Continue all meds Labs pending Health Maintenance reviewed Diet and exercise encouraged RTO 2 weeks to recheck HTN  Evelina Dun, FNP

## 2015-02-07 NOTE — Patient Instructions (Signed)

## 2015-02-08 ENCOUNTER — Other Ambulatory Visit: Payer: Self-pay | Admitting: Family

## 2015-02-08 LAB — CMP14+EGFR
ALK PHOS: 72 IU/L (ref 39–117)
ALT: 28 IU/L (ref 0–32)
AST: 26 IU/L (ref 0–40)
Albumin/Globulin Ratio: 2.5 (ref 1.1–2.5)
Albumin: 4.5 g/dL (ref 3.5–5.5)
BILIRUBIN TOTAL: 0.3 mg/dL (ref 0.0–1.2)
BUN/Creatinine Ratio: 23 (ref 9–23)
BUN: 14 mg/dL (ref 6–24)
CHLORIDE: 102 mmol/L (ref 97–106)
CO2: 23 mmol/L (ref 18–29)
Calcium: 9.3 mg/dL (ref 8.7–10.2)
Creatinine, Ser: 0.62 mg/dL (ref 0.57–1.00)
GFR calc Af Amer: 124 mL/min/{1.73_m2} (ref >59)
GFR calc non Af Amer: 108 mL/min/{1.73_m2} (ref >59)
GLUCOSE: 97 mg/dL (ref 65–99)
Globulin, Total: 1.8 g/dL (ref 1.5–4.5)
Potassium: 4.3 mmol/L (ref 3.5–5.2)
Sodium: 141 mmol/L (ref 136–144)
Total Protein: 6.3 g/dL (ref 6.0–8.5)

## 2015-02-08 LAB — ANEMIA PROFILE B
BASOS ABS: 0 10*3/uL (ref 0.0–0.2)
Basos: 0 %
EOS (ABSOLUTE): 0 10*3/uL (ref 0.0–0.4)
Eos: 0 %
FOLATE: 16.4 ng/mL (ref >3.0)
Ferritin: 136 ng/mL (ref 15–150)
Hematocrit: 44.3 % (ref 34.0–46.6)
Hemoglobin: 14.8 g/dL (ref 11.1–15.9)
Immature Grans (Abs): 0 10*3/uL (ref 0.0–0.1)
Immature Granulocytes: 0 %
Iron Saturation: 32 % (ref 15–55)
Iron: 105 ug/dL (ref 27–159)
LYMPHS ABS: 1.7 10*3/uL (ref 0.7–3.1)
Lymphs: 47 %
MCH: 31.2 pg (ref 26.6–33.0)
MCHC: 33.4 g/dL (ref 31.5–35.7)
MCV: 93 fL (ref 79–97)
MONOCYTES: 15 %
Monocytes Absolute: 0.5 10*3/uL (ref 0.1–0.9)
NEUTROS ABS: 1.4 10*3/uL (ref 1.4–7.0)
NEUTROS PCT: 38 %
PLATELETS: 171 10*3/uL (ref 150–379)
RBC: 4.75 x10E6/uL (ref 3.77–5.28)
RDW: 13.7 % (ref 12.3–15.4)
Retic Ct Pct: 0.8 % (ref 0.6–2.6)
Total Iron Binding Capacity: 325 ug/dL (ref 250–450)
UIBC: 220 ug/dL (ref 131–425)
VITAMIN B 12: 401 pg/mL (ref 211–946)
WBC: 3.6 10*3/uL (ref 3.4–10.8)

## 2015-02-08 LAB — THYROID PANEL WITH TSH
Free Thyroxine Index: 1.7 (ref 1.2–4.9)
T3 Uptake Ratio: 32 % (ref 24–39)
T4 TOTAL: 5.3 ug/dL (ref 4.5–12.0)
TSH: 0.73 u[IU]/mL (ref 0.450–4.500)

## 2015-02-08 LAB — LIPID PANEL
CHOLESTEROL TOTAL: 270 mg/dL — AB (ref 100–199)
Chol/HDL Ratio: 3.9 ratio units (ref 0.0–4.4)
HDL: 70 mg/dL (ref >39)
LDL Calculated: 185 mg/dL — ABNORMAL HIGH (ref 0–99)
Triglycerides: 75 mg/dL (ref 0–149)
VLDL CHOLESTEROL CAL: 15 mg/dL (ref 5–40)

## 2015-02-08 LAB — PAP IG W/ RFLX HPV ASCU: PAP SMEAR COMMENT: 0

## 2015-02-08 LAB — VITAMIN D 25 HYDROXY (VIT D DEFICIENCY, FRACTURES): Vit D, 25-Hydroxy: 38.5 ng/mL (ref 30.0–100.0)

## 2015-02-08 MED ORDER — ATORVASTATIN CALCIUM 40 MG PO TABS: 40.0000 mg | ORAL_TABLET | Freq: Every day | ORAL | Status: DC

## 2015-02-22 ENCOUNTER — Encounter: Payer: Self-pay | Admitting: Family

## 2015-02-22 ENCOUNTER — Ambulatory Visit (INDEPENDENT_AMBULATORY_CARE_PROVIDER_SITE_OTHER): Payer: 59 | Admitting: Family

## 2015-02-22 VITALS — BP 131/86 | HR 69 | Temp 98.4°F | Ht 63.0 in | Wt 149.8 lb

## 2015-02-22 DIAGNOSIS — I10 Essential (primary) hypertension: Secondary | ICD-10-CM

## 2015-02-22 DIAGNOSIS — Z720 Tobacco use: Secondary | ICD-10-CM | POA: Diagnosis not present

## 2015-02-22 DIAGNOSIS — F17200 Nicotine dependence, unspecified, uncomplicated: Secondary | ICD-10-CM

## 2015-02-22 NOTE — Patient Instructions (Signed)
Health Maintenance, Female Adopting a healthy lifestyle and getting preventive care can go a long way to promote health and wellness. Talk with your health care provider about what schedule of regular examinations is right for you. This is a good chance for you to check in with your provider about disease prevention and staying healthy. In between checkups, there are plenty of things you can do on your own. Experts have done a lot of research about which lifestyle changes and preventive measures are most likely to keep you healthy. Ask your health care provider for more information. WEIGHT AND DIET  Eat a healthy diet  Be sure to include plenty of vegetables, fruits, low-fat dairy products, and lean protein.  Do not eat a lot of foods high in solid fats, added sugars, or salt.  Get regular exercise. This is one of the most important things you can do for your health.  Most adults should exercise for at least 150 minutes each week. The exercise should increase your heart rate and make you sweat (moderate-intensity exercise).  Most adults should also do strengthening exercises at least twice a week. This is in addition to the moderate-intensity exercise.  Maintain a healthy weight  Body mass index (BMI) is a measurement that can be used to identify possible weight problems. It estimates body fat based on height and weight. Your health care provider can help determine your BMI and help you achieve or maintain a healthy weight.  For females 20 years of age and older:   A BMI below 18.5 is considered underweight.  A BMI of 18.5 to 24.9 is normal.  A BMI of 25 to 29.9 is considered overweight.  A BMI of 30 and above is considered obese.  Watch levels of cholesterol and blood lipids  You should start having your blood tested for lipids and cholesterol at 47 years of age, then have this test every 5 years.  You may need to have your cholesterol levels checked more often if:  Your lipid  or cholesterol levels are high.  You are older than 47 years of age.  You are at high risk for heart disease.  CANCER SCREENING   Lung Cancer  Lung cancer screening is recommended for adults 55-80 years old who are at high risk for lung cancer because of a history of smoking.  A yearly low-dose CT scan of the lungs is recommended for people who:  Currently smoke.  Have quit within the past 15 years.  Have at least a 30-pack-year history of smoking. A pack year is smoking an average of one pack of cigarettes a day for 1 year.  Yearly screening should continue until it has been 15 years since you quit.  Yearly screening should stop if you develop a health problem that would prevent you from having lung cancer treatment.  Breast Cancer  Practice breast self-awareness. This means understanding how your breasts normally appear and feel.  It also means doing regular breast self-exams. Let your health care provider know about any changes, no matter how small.  If you are in your 20s or 30s, you should have a clinical breast exam (CBE) by a health care provider every 1-3 years as part of a regular health exam.  If you are 40 or older, have a CBE every year. Also consider having a breast X-ray (mammogram) every year.  If you have a family history of breast cancer, talk to your health care provider about genetic screening.  If you   are at high risk for breast cancer, talk to your health care provider about having an MRI and a mammogram every year.  Breast cancer gene (BRCA) assessment is recommended for women who have family members with BRCA-related cancers. BRCA-related cancers include:  Breast.  Ovarian.  Tubal.  Peritoneal cancers.  Results of the assessment will determine the need for genetic counseling and BRCA1 and BRCA2 testing. Cervical Cancer Your health care provider may recommend that you be screened regularly for cancer of the pelvic organs (ovaries, uterus, and  vagina). This screening involves a pelvic examination, including checking for microscopic changes to the surface of your cervix (Pap test). You may be encouraged to have this screening done every 3 years, beginning at age 21.  For women ages 30-65, health care providers may recommend pelvic exams and Pap testing every 3 years, or they may recommend the Pap and pelvic exam, combined with testing for human papilloma virus (HPV), every 5 years. Some types of HPV increase your risk of cervical cancer. Testing for HPV may also be done on women of any age with unclear Pap test results.  Other health care providers may not recommend any screening for nonpregnant women who are considered low risk for pelvic cancer and who do not have symptoms. Ask your health care provider if a screening pelvic exam is right for you.  If you have had past treatment for cervical cancer or a condition that could lead to cancer, you need Pap tests and screening for cancer for at least 20 years after your treatment. If Pap tests have been discontinued, your risk factors (such as having a new sexual partner) need to be reassessed to determine if screening should resume. Some women have medical problems that increase the chance of getting cervical cancer. In these cases, your health care provider may recommend more frequent screening and Pap tests. Colorectal Cancer  This type of cancer can be detected and often prevented.  Routine colorectal cancer screening usually begins at 47 years of age and continues through 47 years of age.  Your health care provider may recommend screening at an earlier age if you have risk factors for colon cancer.  Your health care provider may also recommend using home test kits to check for hidden blood in the stool.  A small camera at the end of a tube can be used to examine your colon directly (sigmoidoscopy or colonoscopy). This is done to check for the earliest forms of colorectal  cancer.  Routine screening usually begins at age 50.  Direct examination of the colon should be repeated every 5-10 years through 47 years of age. However, you may need to be screened more often if early forms of precancerous polyps or small growths are found. Skin Cancer  Check your skin from head to toe regularly.  Tell your health care provider about any new moles or changes in moles, especially if there is a change in a mole's shape or color.  Also tell your health care provider if you have a mole that is larger than the size of a pencil eraser.  Always use sunscreen. Apply sunscreen liberally and repeatedly throughout the day.  Protect yourself by wearing long sleeves, pants, a wide-brimmed hat, and sunglasses whenever you are outside. HEART DISEASE, DIABETES, AND HIGH BLOOD PRESSURE   High blood pressure causes heart disease and increases the risk of stroke. High blood pressure is more likely to develop in:  People who have blood pressure in the high end   of the normal range (130-139/85-89 mm Hg).  People who are overweight or obese.  People who are African American.  If you are 38-23 years of age, have your blood pressure checked every 3-5 years. If you are 61 years of age or older, have your blood pressure checked every year. You should have your blood pressure measured twice--once when you are at a hospital or clinic, and once when you are not at a hospital or clinic. Record the average of the two measurements. To check your blood pressure when you are not at a hospital or clinic, you can use:  An automated blood pressure machine at a pharmacy.  A home blood pressure monitor.  If you are between 45 years and 39 years old, ask your health care provider if you should take aspirin to prevent strokes.  Have regular diabetes screenings. This involves taking a blood sample to check your fasting blood sugar level.  If you are at a normal weight and have a low risk for diabetes,  have this test once every three years after 47 years of age.  If you are overweight and have a high risk for diabetes, consider being tested at a younger age or more often. PREVENTING INFECTION  Hepatitis B  If you have a higher risk for hepatitis B, you should be screened for this virus. You are considered at high risk for hepatitis B if:  You were born in a country where hepatitis B is common. Ask your health care provider which countries are considered high risk.  Your parents were born in a high-risk country, and you have not been immunized against hepatitis B (hepatitis B vaccine).  You have HIV or AIDS.  You use needles to inject street drugs.  You live with someone who has hepatitis B.  You have had sex with someone who has hepatitis B.  You get hemodialysis treatment.  You take certain medicines for conditions, including cancer, organ transplantation, and autoimmune conditions. Hepatitis C  Blood testing is recommended for:  Everyone born from [birth year] through 1963.  Anyone with known risk factors for hepatitis C. Sexually transmitted infections (STIs)  You should be screened for sexually transmitted infections (STIs) including gonorrhea and chlamydia if:  You are sexually active and are younger than 47 years of age.  You are older than 47 years of age and your health care provider tells you that you are at risk for this type of infection.  Your sexual activity has changed since you were last screened and you are at an increased risk for chlamydia or gonorrhea. Ask your health care provider if you are at risk.  If you do not have HIV, but are at risk, it may be recommended that you take a prescription medicine daily to prevent HIV infection. This is called pre-exposure prophylaxis (PrEP). You are considered at risk if:  You are sexually active and do not regularly use condoms or know the HIV status of your partner(s).  You take drugs by injection.  You are sexually  active with a partner who has HIV. Talk with your health care provider about whether you are at high risk of being infected with HIV. If you choose to begin PrEP, you should first be tested for HIV. You should then be tested every 3 months for as long as you are taking PrEP.  PREGNANCY   If you are premenopausal and you may become pregnant, ask your health care provider about preconception counseling.  If you may  become pregnant, take 400 to 800 micrograms (mcg) of folic acid every day.  If you want to prevent pregnancy, talk to your health care provider about birth control (contraception). OSTEOPOROSIS AND MENOPAUSE   Osteoporosis is a disease in which the bones lose minerals and strength with aging. This can result in serious bone fractures. Your risk for osteoporosis can be identified using a bone density scan.  If you are 61 years of age or older, or if you are at risk for osteoporosis and fractures, ask your health care provider if you should be screened.  Ask your health care provider whether you should take a calcium or vitamin D supplement to lower your risk for osteoporosis.  Menopause may have certain physical symptoms and risks.  Hormone replacement therapy may reduce some of these symptoms and risks. Talk to your health care provider about whether hormone replacement therapy is right for you.  HOME CARE INSTRUCTIONS   Schedule regular health, dental, and eye exams.  Stay current with your immunizations.   Do not use any tobacco products including cigarettes, chewing tobacco, or electronic cigarettes.  If you are pregnant, do not drink alcohol.  If you are breastfeeding, limit how much and how often you drink alcohol.  Limit alcohol intake to no more than 1 drink per day for nonpregnant women. One drink equals 12 ounces of beer, 5 ounces of wine, or 1 ounces of hard liquor.  Do not use street drugs.  Do not share needles.  Ask your health care provider for help if  you need support or information about quitting drugs.  Tell your health care provider if you often feel depressed.  Tell your health care provider if you have ever been abused or do not feel safe at home.   This information is not intended to replace advice given to you by your health care provider. Make sure you discuss any questions you have with your health care provider.   Document Released: 10/23/2010 Document Revised: 04/30/2014 Document Reviewed: 03/11/2013 Elsevier Interactive Patient Education [employer].

## 2015-02-22 NOTE — Progress Notes (Signed)
   Subjective:    Patient ID: Maria West, female    DOB: 1965-05-14, 49 y.o.   MRN: [id number]  Pt presents to the office today to recheck HTN. Pt's BP is at goal today! Hypertension This is a chronic problem. The current episode started more than 1 year ago. The problem has been resolved since onset. The problem is controlled. Pertinent negatives include no anxiety, headaches, palpitations, peripheral edema or shortness of breath. Risk factors for coronary artery disease include dyslipidemia, obesity, post-menopausal state, sedentary lifestyle and smoking/tobacco exposure. Past treatments include diuretics and ACE inhibitors. The current treatment provides significant improvement. There is no history of kidney disease, CAD/MI, heart failure or a thyroid problem. There is no history of sleep apnea.      Review of Systems  Constitutional: Negative.   HENT: Negative.   Eyes: Negative.   Respiratory: Negative.  Negative for shortness of breath.   Cardiovascular: Negative.  Negative for palpitations.  Gastrointestinal: Negative.   Endocrine: Negative.   Genitourinary: Negative.   Musculoskeletal: Negative.   Neurological: Negative.  Negative for headaches.  Hematological: Negative.   Psychiatric/Behavioral: Negative.   All other systems reviewed and are negative.      Objective:   Physical Exam  Constitutional: She is oriented to person, place, and time. She appears well-developed and well-nourished. No distress.  HENT:  Head: Normocephalic and atraumatic.  Eyes: Pupils are equal, round, and reactive to light.  Neck: Normal range of motion. Neck supple. No thyromegaly present.  Cardiovascular: Normal rate, regular rhythm, normal heart sounds and intact distal pulses.   No murmur heard. Pulmonary/Chest: Effort normal and breath sounds normal. No respiratory distress. She has no wheezes.  Abdominal: Soft. Bowel sounds are normal. She exhibits no distension. There is no tenderness.   Musculoskeletal: Normal range of motion. She exhibits no edema or tenderness.  Neurological: She is alert and oriented to person, place, and time. She has normal reflexes. No cranial nerve deficit.  Skin: Skin is warm and dry.  Psychiatric: She has a normal mood and affect. Her behavior is normal. Judgment and thought content normal.  Vitals reviewed.   BP 131/86 mmHg  Pulse 69  Temp(Src) 98.4 F (36.9 C) (Oral)  Ht $R'5\' 3"'wM$  (1.6 m)  Wt 149 lb 12.8 oz (67.949 kg)  BMI 26.54 kg/m2  LMP 08/18/2009       Assessment & Plan:  1. Essential hypertension -Dash diet information given -Exercise encouraged - Stress Management  -Continue current meds -RTO in 6 months - BMP8+EGFR  2. Current smoker Smoking cessation discussed   Evelina Dun, FNP

## 2015-02-23 LAB — BMP8+EGFR
BUN / CREAT RATIO: 19 (ref 9–23)
BUN: 14 mg/dL (ref 6–24)
CO2: 21 mmol/L (ref 18–29)
CREATININE: 0.73 mg/dL (ref 0.57–1.00)
Calcium: 10 mg/dL (ref 8.7–10.2)
Chloride: 96 mmol/L — ABNORMAL LOW (ref 97–106)
GFR, EST AFRICAN AMERICAN: 113 mL/min/{1.73_m2} (ref >59)
GFR, EST NON AFRICAN AMERICAN: 98 mL/min/{1.73_m2} (ref >59)
Glucose: 83 mg/dL (ref 65–99)
POTASSIUM: 4.6 mmol/L (ref 3.5–5.2)
SODIUM: 137 mmol/L (ref 136–144)

## 2015-03-14 ENCOUNTER — Telehealth: Payer: Self-pay | Admitting: Family

## 2015-03-15 NOTE — Telephone Encounter (Signed)
Pt can take half a tablet for next week with food. Then increase to whole tablet and see if she can tolerate dose

## 2015-03-15 NOTE — Telephone Encounter (Signed)
Patient aware and verbalizes understanding. 

## 2015-04-19 LAB — HM MAMMOGRAPHY: HM Mammogram: NEGATIVE

## 2015-05-03 ENCOUNTER — Encounter: Payer: Self-pay | Admitting: *Deleted

## 2015-08-22 ENCOUNTER — Encounter (INDEPENDENT_AMBULATORY_CARE_PROVIDER_SITE_OTHER): Payer: Self-pay

## 2015-08-22 ENCOUNTER — Encounter: Payer: Self-pay | Admitting: *Deleted

## 2015-08-22 ENCOUNTER — Ambulatory Visit (INDEPENDENT_AMBULATORY_CARE_PROVIDER_SITE_OTHER): Payer: BLUE CROSS/BLUE SHIELD | Admitting: Family

## 2015-08-22 ENCOUNTER — Encounter: Payer: Self-pay | Admitting: Family

## 2015-08-22 VITALS — BP 121/76 | HR 79 | Temp 97.8°F | Ht 63.0 in | Wt 146.1 lb

## 2015-08-22 DIAGNOSIS — I10 Essential (primary) hypertension: Secondary | ICD-10-CM

## 2015-08-22 DIAGNOSIS — Z720 Tobacco use: Secondary | ICD-10-CM | POA: Diagnosis not present

## 2015-08-22 DIAGNOSIS — B351 Tinea unguium: Secondary | ICD-10-CM

## 2015-08-22 DIAGNOSIS — M25511 Pain in right shoulder: Secondary | ICD-10-CM | POA: Diagnosis not present

## 2015-08-22 DIAGNOSIS — F411 Generalized anxiety disorder: Secondary | ICD-10-CM

## 2015-08-22 DIAGNOSIS — M5412 Radiculopathy, cervical region: Secondary | ICD-10-CM | POA: Diagnosis not present

## 2015-08-22 DIAGNOSIS — E785 Hyperlipidemia, unspecified: Secondary | ICD-10-CM | POA: Diagnosis not present

## 2015-08-22 DIAGNOSIS — E559 Vitamin D deficiency, unspecified: Secondary | ICD-10-CM | POA: Diagnosis not present

## 2015-08-22 DIAGNOSIS — F32A Depression, unspecified: Secondary | ICD-10-CM

## 2015-08-22 DIAGNOSIS — F329 Major depressive disorder, single episode, unspecified: Secondary | ICD-10-CM | POA: Diagnosis not present

## 2015-08-22 DIAGNOSIS — F17200 Nicotine dependence, unspecified, uncomplicated: Secondary | ICD-10-CM

## 2015-08-22 NOTE — Progress Notes (Signed)
Subjective:    Patient ID: Maria West, female    DOB: 08-29-1965, 50 y.o.   MRN: [id number]  Pt presents to the office today for chronic follow up. PT is complaining of toenail fungus. PT states she has used OTC ointment with success.  Hypertension This is a chronic problem. The current episode started more than 1 year ago. The problem has been resolved since onset. The problem is controlled. Associated symptoms include neck pain. Pertinent negatives include no anxiety, blurred vision, chest pain, headaches, palpitations, peripheral edema or shortness of breath. Risk factors for coronary artery disease include dyslipidemia, obesity, post-menopausal state, sedentary lifestyle and smoking/tobacco exposure. Past treatments include diuretics and ACE inhibitors. The current treatment provides significant improvement. There are no compliance problems.  There is no history of kidney disease, CAD/MI, heart failure or a thyroid problem. There is no history of sleep apnea.  Shoulder Pain  The pain is present in the right shoulder. This is a chronic problem. The current episode started more than 1 year ago. There has been no history of extremity trauma. The problem has been waxing and waning. The quality of the pain is described as aching. The pain is at a severity of 7/10. The pain is moderate. Associated symptoms include an inability to bear weight and stiffness. Pertinent negatives include no joint swelling, limited range of motion, numbness or tingling. The symptoms are aggravated by activity. She has tried acetaminophen and rest for the symptoms. The treatment provided significant relief. Family history does not include rheumatoid arthritis. There is no history of diabetes, gout, osteoarthritis or rheumatoid arthritis.  Hyperlipidemia This is a chronic problem. The current episode started more than 1 year ago. The problem is uncontrolled. Recent lipid tests were reviewed and are high. She has no history  of diabetes. Associated symptoms include myalgias. Pertinent negatives include no chest pain or shortness of breath. Current antihyperlipidemic treatment includes statins. The current treatment provides mild improvement of lipids. There are no compliance problems.  Risk factors for coronary artery disease include dyslipidemia and hypertension.  Depression      The patient presents with depression.  This is a chronic problem.  The current episode started more than 1 year ago.   The onset quality is gradual.   The problem has been waxing and waning since onset.  Associated symptoms include myalgias.  Associated symptoms include does not have insomnia, no restlessness and no headaches.  Past treatments include SSRIs - Selective serotonin reuptake inhibitors.  Past medical history includes depression.     Pertinent negatives include no thyroid problem and no anxiety. Anxiety Presents for follow-up visit. Patient reports no chest pain, compulsions, depressed mood, excessive worry, insomnia, irritability, nervous/anxious behavior, obsessions, palpitations, restlessness or shortness of breath. Symptoms occur rarely. The severity of symptoms is mild. The symptoms are aggravated by work stress. The quality of sleep is good. Nighttime awakenings: none.   Her past medical history is significant for anxiety/panic attacks and depression. Past treatments include lifestyle changes and SSRIs. The treatment provided significant relief. Compliance with prior treatments has been good. Compliance with medications is 76-100%.  Neck Pain  This is a chronic problem. The current episode started more than 1 year ago. The problem occurs intermittently. The problem has been waxing and waning. The pain is associated with nothing. The quality of the pain is described as aching. The pain is at a severity of 7/10. The pain is moderate. The symptoms are aggravated by bending. Pertinent negatives include no  chest pain, headaches, numbness  or tingling. She has tried muscle relaxants and NSAIDs for the symptoms. The treatment provided moderate relief.      Review of Systems  Constitutional: Negative.  Negative for irritability.  HENT: Negative.  Negative for postnasal drip and rhinorrhea.   Eyes: Negative.  Negative for blurred vision.  Respiratory: Negative.  Negative for shortness of breath.   Cardiovascular: Negative.  Negative for chest pain and palpitations.  Gastrointestinal: Negative.   Endocrine: Negative.   Genitourinary: Negative.   Musculoskeletal: Positive for myalgias, stiffness and neck pain. Negative for gout.  Neurological: Negative.  Negative for tingling, numbness and headaches.  Hematological: Negative.   Psychiatric/Behavioral: Positive for depression. Negative for sleep disturbance, dysphoric mood and agitation. The patient is not nervous/anxious and does not have insomnia.   All other systems reviewed and are negative.      Objective:   Physical Exam  Constitutional: She is oriented to person, place, and time. She appears well-developed and well-nourished. No distress.  HENT:  Head: Normocephalic and atraumatic.  Eyes: Pupils are equal, round, and reactive to light.  Neck: Normal range of motion. Neck supple. No thyromegaly present.  Cardiovascular: Normal rate, regular rhythm, normal heart sounds and intact distal pulses.   No murmur heard. Pulmonary/Chest: Effort normal and breath sounds normal. No respiratory distress. She has no wheezes.  Abdominal: Soft. Bowel sounds are normal. She exhibits no distension. There is no tenderness.  Musculoskeletal: Normal range of motion. She exhibits no edema or tenderness.  Neurological: She is alert and oriented to person, place, and time. She has normal reflexes. No cranial nerve deficit.  Skin: Skin is warm and dry.  Psychiatric: She has a normal mood and affect. Her behavior is normal. Judgment and thought content normal.  Vitals reviewed.   BP  121/76 mmHg  Pulse 79  Temp(Src) 97.8 F (36.6 C) (Oral)  Ht '5\' 3"'  (1.6 m)  Wt 146 lb 2 oz (66.282 kg)  BMI 25.89 kg/m2  LMP 08/18/2009       Assessment & Plan:  1. Hyperlipidemia - CMP14+EGFR - Lipid panel  2. Current smoker - CMP14+EGFR  3. Depression - CMP14+EGFR  4. Essential hypertension - CMP14+EGFR  5. Vitamin D deficienc - CMP14+EGFR - VITAMIN D 25 Hydroxy (Vit-D Deficiency, Fractures)  6. Cervical radiculopathy - CMP14+EGFR  7. GAD (generalized anxiety disorder) - CMP14+EGFR  8. Toenail fungus - Continue OTC ointment   9. Right shoulder pain   Continue all meds Labs pending Health Maintenance reviewed Diet and exercise encouraged RTO 6 months  Evelina Dun, FNP

## 2015-08-22 NOTE — Patient Instructions (Signed)
Health Maintenance, Female Adopting a healthy lifestyle and getting preventive care can go a long way to promote health and wellness. Talk with your health care provider about what schedule of regular examinations is right for you. This is a good chance for you to check in with your provider about disease prevention and staying healthy. In between checkups, there are plenty of things you can do on your own. Experts have done a lot of research about which lifestyle changes and preventive measures are most likely to keep you healthy. Ask your health care provider for more information. WEIGHT AND DIET  Eat a healthy diet  Be sure to include plenty of vegetables, fruits, low-fat dairy products, and lean protein.  Do not eat a lot of foods high in solid fats, added sugars, or salt.  Get regular exercise. This is one of the most important things you can do for your health.  Most adults should exercise for at least 150 minutes each week. The exercise should increase your heart rate and make you sweat (moderate-intensity exercise).  Most adults should also do strengthening exercises at least twice a week. This is in addition to the moderate-intensity exercise.  Maintain a healthy weight  Body mass index (BMI) is a measurement that can be used to identify possible weight problems. It estimates body fat based on height and weight. Your health care provider can help determine your BMI and help you achieve or maintain a healthy weight.  For females 20 years of age and older:   A BMI below 18.5 is considered underweight.  A BMI of 18.5 to 24.9 is normal.  A BMI of 25 to 29.9 is considered overweight.  A BMI of 30 and above is considered obese.  Watch levels of cholesterol and blood lipids  You should start having your blood tested for lipids and cholesterol at 48 years of age, then have this test every 5 years.  You may need to have your cholesterol levels checked more often if:  Your lipid  or cholesterol levels are high.  You are older than 48 years of age.  You are at high risk for heart disease.  CANCER SCREENING   Lung Cancer  Lung cancer screening is recommended for adults 55-80 years old who are at high risk for lung cancer because of a history of smoking.  A yearly low-dose CT scan of the lungs is recommended for people who:  Currently smoke.  Have quit within the past 15 years.  Have at least a 30-pack-year history of smoking. A pack year is smoking an average of one pack of cigarettes a day for 1 year.  Yearly screening should continue until it has been 15 years since you quit.  Yearly screening should stop if you develop a health problem that would prevent you from having lung cancer treatment.  Breast Cancer  Practice breast self-awareness. This means understanding how your breasts normally appear and feel.  It also means doing regular breast self-exams. Let your health care provider know about any changes, no matter how small.  If you are in your 20s or 30s, you should have a clinical breast exam (CBE) by a health care provider every 1-3 years as part of a regular health exam.  If you are 40 or older, have a CBE every year. Also consider having a breast X-ray (mammogram) every year.  If you have a family history of breast cancer, talk to your health care provider about genetic screening.  If you   are at high risk for breast cancer, talk to your health care provider about having an MRI and a mammogram every year.  Breast cancer gene (BRCA) assessment is recommended for women who have family members with BRCA-related cancers. BRCA-related cancers include:  Breast.  Ovarian.  Tubal.  Peritoneal cancers.  Results of the assessment will determine the need for genetic counseling and BRCA1 and BRCA2 testing. Cervical Cancer Your health care provider may recommend that you be screened regularly for cancer of the pelvic organs (ovaries, uterus, and  vagina). This screening involves a pelvic examination, including checking for microscopic changes to the surface of your cervix (Pap test). You may be encouraged to have this screening done every 3 years, beginning at age 21.  For women ages 30-65, health care providers may recommend pelvic exams and Pap testing every 3 years, or they may recommend the Pap and pelvic exam, combined with testing for human papilloma virus (HPV), every 5 years. Some types of HPV increase your risk of cervical cancer. Testing for HPV may also be done on women of any age with unclear Pap test results.  Other health care providers may not recommend any screening for nonpregnant women who are considered low risk for pelvic cancer and who do not have symptoms. Ask your health care provider if a screening pelvic exam is right for you.  If you have had past treatment for cervical cancer or a condition that could lead to cancer, you need Pap tests and screening for cancer for at least 20 years after your treatment. If Pap tests have been discontinued, your risk factors (such as having a new sexual partner) need to be reassessed to determine if screening should resume. Some women have medical problems that increase the chance of getting cervical cancer. In these cases, your health care provider may recommend more frequent screening and Pap tests. Colorectal Cancer  This type of cancer can be detected and often prevented.  Routine colorectal cancer screening usually begins at 48 years of age and continues through 48 years of age.  Your health care provider may recommend screening at an earlier age if you have risk factors for colon cancer.  Your health care provider may also recommend using home test kits to check for hidden blood in the stool.  A small camera at the end of a tube can be used to examine your colon directly (sigmoidoscopy or colonoscopy). This is done to check for the earliest forms of colorectal  cancer.  Routine screening usually begins at age 50.  Direct examination of the colon should be repeated every 5-10 years through 48 years of age. However, you may need to be screened more often if early forms of precancerous polyps or small growths are found. Skin Cancer  Check your skin from head to toe regularly.  Tell your health care provider about any new moles or changes in moles, especially if there is a change in a mole's shape or color.  Also tell your health care provider if you have a mole that is larger than the size of a pencil eraser.  Always use sunscreen. Apply sunscreen liberally and repeatedly throughout the day.  Protect yourself by wearing long sleeves, pants, a wide-brimmed hat, and sunglasses whenever you are outside. HEART DISEASE, DIABETES, AND HIGH BLOOD PRESSURE   High blood pressure causes heart disease and increases the risk of stroke. High blood pressure is more likely to develop in:  People who have blood pressure in the high end   of the normal range (130-139/85-89 mm Hg).  People who are overweight or obese.  People who are African American.  If you are 38-23 years of age, have your blood pressure checked every 3-5 years. If you are 61 years of age or older, have your blood pressure checked every year. You should have your blood pressure measured twice--once when you are at a hospital or clinic, and once when you are not at a hospital or clinic. Record the average of the two measurements. To check your blood pressure when you are not at a hospital or clinic, you can use:  An automated blood pressure machine at a pharmacy.  A home blood pressure monitor.  If you are between 45 years and 39 years old, ask your health care provider if you should take aspirin to prevent strokes.  Have regular diabetes screenings. This involves taking a blood sample to check your fasting blood sugar level.  If you are at a normal weight and have a low risk for diabetes,  have this test once every three years after 48 years of age.  If you are overweight and have a high risk for diabetes, consider being tested at a younger age or more often. PREVENTING INFECTION  Hepatitis B  If you have a higher risk for hepatitis B, you should be screened for this virus. You are considered at high risk for hepatitis B if:  You were born in a country where hepatitis B is common. Ask your health care provider which countries are considered high risk.  Your parents were born in a high-risk country, and you have not been immunized against hepatitis B (hepatitis B vaccine).  You have HIV or AIDS.  You use needles to inject street drugs.  You live with someone who has hepatitis B.  You have had sex with someone who has hepatitis B.  You get hemodialysis treatment.  You take certain medicines for conditions, including cancer, organ transplantation, and autoimmune conditions. Hepatitis C  Blood testing is recommended for:  Everyone born from [birth year] through 1963.  Anyone with known risk factors for hepatitis C. Sexually transmitted infections (STIs)  You should be screened for sexually transmitted infections (STIs) including gonorrhea and chlamydia if:  You are sexually active and are younger than 48 years of age.  You are older than 48 years of age and your health care provider tells you that you are at risk for this type of infection.  Your sexual activity has changed since you were last screened and you are at an increased risk for chlamydia or gonorrhea. Ask your health care provider if you are at risk.  If you do not have HIV, but are at risk, it may be recommended that you take a prescription medicine daily to prevent HIV infection. This is called pre-exposure prophylaxis (PrEP). You are considered at risk if:  You are sexually active and do not regularly use condoms or know the HIV status of your partner(s).  You take drugs by injection.  You are sexually  active with a partner who has HIV. Talk with your health care provider about whether you are at high risk of being infected with HIV. If you choose to begin PrEP, you should first be tested for HIV. You should then be tested every 3 months for as long as you are taking PrEP.  PREGNANCY   If you are premenopausal and you may become pregnant, ask your health care provider about preconception counseling.  If you may  become pregnant, take 400 to 800 micrograms (mcg) of folic acid every day.  If you want to prevent pregnancy, talk to your health care provider about birth control (contraception). OSTEOPOROSIS AND MENOPAUSE   Osteoporosis is a disease in which the bones lose minerals and strength with aging. This can result in serious bone fractures. Your risk for osteoporosis can be identified using a bone density scan.  If you are 61 years of age or older, or if you are at risk for osteoporosis and fractures, ask your health care provider if you should be screened.  Ask your health care provider whether you should take a calcium or vitamin D supplement to lower your risk for osteoporosis.  Menopause may have certain physical symptoms and risks.  Hormone replacement therapy may reduce some of these symptoms and risks. Talk to your health care provider about whether hormone replacement therapy is right for you.  HOME CARE INSTRUCTIONS   Schedule regular health, dental, and eye exams.  Stay current with your immunizations.   Do not use any tobacco products including cigarettes, chewing tobacco, or electronic cigarettes.  If you are pregnant, do not drink alcohol.  If you are breastfeeding, limit how much and how often you drink alcohol.  Limit alcohol intake to no more than 1 drink per day for nonpregnant women. One drink equals 12 ounces of beer, 5 ounces of wine, or 1 ounces of hard liquor.  Do not use street drugs.  Do not share needles.  Ask your health care provider for help if  you need support or information about quitting drugs.  Tell your health care provider if you often feel depressed.  Tell your health care provider if you have ever been abused or do not feel safe at home.   This information is not intended to replace advice given to you by your health care provider. Make sure you discuss any questions you have with your health care provider.   Document Released: 10/23/2010 Document Revised: 04/30/2014 Document Reviewed: 03/11/2013 Elsevier Interactive Patient Education [employer].

## 2015-08-23 ENCOUNTER — Other Ambulatory Visit: Payer: Self-pay | Admitting: Family

## 2015-08-23 LAB — LIPID PANEL
CHOL/HDL RATIO: 3.9 ratio (ref 0.0–4.4)
Cholesterol, Total: 285 mg/dL — ABNORMAL HIGH (ref 100–199)
HDL: 73 mg/dL (ref >39)
LDL Calculated: 195 mg/dL — ABNORMAL HIGH (ref 0–99)
TRIGLYCERIDES: 84 mg/dL (ref 0–149)
VLDL CHOLESTEROL CAL: 17 mg/dL (ref 5–40)

## 2015-08-23 LAB — CMP14+EGFR
ALT: 15 IU/L (ref 0–32)
AST: 16 IU/L (ref 0–40)
Albumin/Globulin Ratio: 2.1 (ref 1.2–2.2)
Albumin: 4.8 g/dL (ref 3.5–5.5)
Alkaline Phosphatase: 63 IU/L (ref 39–117)
BUN/Creatinine Ratio: 18 (ref 9–23)
BUN: 13 mg/dL (ref 6–24)
Bilirubin Total: 0.3 mg/dL (ref 0.0–1.2)
CALCIUM: 10 mg/dL (ref 8.7–10.2)
CO2: 24 mmol/L (ref 18–29)
CREATININE: 0.71 mg/dL (ref 0.57–1.00)
Chloride: 102 mmol/L (ref 96–106)
GFR, EST AFRICAN AMERICAN: 117 mL/min/{1.73_m2} (ref >59)
GFR, EST NON AFRICAN AMERICAN: 102 mL/min/{1.73_m2} (ref >59)
GLUCOSE: 88 mg/dL (ref 65–99)
Globulin, Total: 2.3 g/dL (ref 1.5–4.5)
Potassium: 4.8 mmol/L (ref 3.5–5.2)
Sodium: 142 mmol/L (ref 134–144)
TOTAL PROTEIN: 7.1 g/dL (ref 6.0–8.5)

## 2015-08-23 LAB — VITAMIN D 25 HYDROXY (VIT D DEFICIENCY, FRACTURES): VIT D 25 HYDROXY: 50.6 ng/mL (ref 30.0–100.0)

## 2015-08-23 MED ORDER — ATORVASTATIN CALCIUM 40 MG PO TABS: 40.0000 mg | ORAL_TABLET | Freq: Every day | ORAL | Status: DC

## 2015-08-23 NOTE — Addendum Note (Signed)
Addended by: Tamera PuntWRAY, Zay Yeargan S on: 08/23/2015 03:34 PM   Modules accepted: Orders

## 2016-02-27 ENCOUNTER — Encounter: Payer: Self-pay | Admitting: Family

## 2016-02-27 ENCOUNTER — Ambulatory Visit (INDEPENDENT_AMBULATORY_CARE_PROVIDER_SITE_OTHER): Payer: BLUE CROSS/BLUE SHIELD | Admitting: Family

## 2016-02-27 VITALS — BP 139/80 | HR 70 | Temp 98.0°F | Ht 63.0 in | Wt 149.4 lb

## 2016-02-27 DIAGNOSIS — Z0000 Encounter for general adult medical examination without abnormal findings: Secondary | ICD-10-CM | POA: Diagnosis not present

## 2016-02-27 DIAGNOSIS — E559 Vitamin D deficiency, unspecified: Secondary | ICD-10-CM

## 2016-02-27 DIAGNOSIS — F411 Generalized anxiety disorder: Secondary | ICD-10-CM

## 2016-02-27 DIAGNOSIS — F331 Major depressive disorder, recurrent, moderate: Secondary | ICD-10-CM

## 2016-02-27 DIAGNOSIS — I10 Essential (primary) hypertension: Secondary | ICD-10-CM | POA: Diagnosis not present

## 2016-02-27 DIAGNOSIS — E785 Hyperlipidemia, unspecified: Secondary | ICD-10-CM | POA: Diagnosis not present

## 2016-02-27 DIAGNOSIS — F17200 Nicotine dependence, unspecified, uncomplicated: Secondary | ICD-10-CM

## 2016-02-27 DIAGNOSIS — Z01419 Encounter for gynecological examination (general) (routine) without abnormal findings: Secondary | ICD-10-CM | POA: Diagnosis not present

## 2016-02-27 LAB — URINALYSIS, COMPLETE
BILIRUBIN UA: NEGATIVE
Glucose, UA: NEGATIVE
KETONES UA: NEGATIVE
LEUKOCYTES UA: NEGATIVE
NITRITE UA: NEGATIVE
PH UA: 6 (ref 5.0–7.5)
Protein, UA: NEGATIVE
SPEC GRAV UA: 1.015 (ref 1.005–1.030)
Urobilinogen, Ur: 0.2 mg/dL (ref 0.2–1.0)

## 2016-02-27 LAB — MICROSCOPIC EXAMINATION

## 2016-02-27 NOTE — Patient Instructions (Addendum)
Plantar Fasciitis Plantar fasciitis is a painful foot condition that affects the heel. It occurs when the band of tissue that connects the toes to the heel bone (plantar fascia) becomes irritated. This can happen after exercising too much or doing other repetitive activities (overuse injury). The pain from plantar fasciitis can range from mild irritation to severe pain that makes it difficult for you to walk or move. The pain is usually worse in the morning or after you have been sitting or lying down for a while. CAUSES This condition may be caused by:  Standing for long periods of time.  Wearing shoes that do not fit.  Doing high-impact activities, including running, aerobics, and ballet.  Being overweight.  Having an abnormal way of walking (gait).  Having tight calf muscles.  Having high arches in your feet.  Starting a new athletic activity. SYMPTOMS The main symptom of this condition is heel pain. Other symptoms include:  Pain that gets worse after activity or exercise.  Pain that is worse in the morning or after resting.  Pain that goes away after you walk for a few minutes. DIAGNOSIS This condition may be diagnosed based on your signs and symptoms. Your health care provider will also do a physical exam to check for:  A tender area on the bottom of your foot.  A high arch in your foot.  Pain when you move your foot.  Difficulty moving your foot. You may also need to have imaging studies to confirm the diagnosis. These can include:  X-rays.  Ultrasound.  MRI. TREATMENT  Treatment for plantar fasciitis depends on the severity of the condition. Your treatment may include:  Rest, ice, and over-the-counter pain medicines to manage your pain.  Exercises to stretch your calves and your plantar fascia.  A splint that holds your foot in a stretched, upward position while you sleep (night splint).  Physical therapy to relieve symptoms and prevent problems in the  future.  Cortisone injections to relieve severe pain.  Extracorporeal shock wave therapy (ESWT) to stimulate damaged plantar fascia with electrical impulses. It is often used as a last resort before surgery.  Surgery, if other treatments have not worked after 12 months. HOME CARE INSTRUCTIONS  Take medicines only as directed by your health care provider.  Avoid activities that cause pain.  Roll the bottom of your foot over a bag of ice or a bottle of cold water. Do this for 20 minutes, 3-4 times a day.  Perform simple stretches as directed by your health care provider.  Try wearing athletic shoes with air-sole or gel-sole cushions or soft shoe inserts.  Wear a night splint while sleeping, if directed by your health care provider.  Keep all follow-up appointments with your health care provider. PREVENTION   Do not perform exercises or activities that cause heel pain.  Consider finding low-impact activities if you continue to have problems.  Lose weight if you need to. The best way to prevent plantar fasciitis is to avoid the activities that aggravate your plantar fascia. SEEK MEDICAL CARE IF:  Your symptoms do not go away after treatment with home care measures.  Your pain gets worse.  Your pain affects your ability to move or do your daily activities.   This information is not intended to replace advice given to you by your health care provider. Make sure you discuss any questions you have with your health care provider.   Document Released: 01/02/2001 Document Revised: 12/29/2014 Document Reviewed: 02/17/2014 Elsevier   Interactive Patient Education 2016 [employer]  

## 2016-02-27 NOTE — Progress Notes (Signed)
Subjective:    Patient ID: Maria West, female    DOB: 27-Oct-1965, 50 y.o.   MRN: [id number]  Pt presents to the office today for CPE with pap.   Hypertension  This is a chronic problem. The current episode started more than 1 year ago. The problem has been resolved since onset. The problem is controlled. Pertinent negatives include no anxiety, blurred vision, palpitations, peripheral edema or shortness of breath. Risk factors for coronary artery disease include dyslipidemia, obesity, post-menopausal state, sedentary lifestyle and smoking/tobacco exposure. Past treatments include diuretics and ACE inhibitors. The current treatment provides significant improvement. There are no compliance problems.  There is no history of kidney disease, CAD/MI, heart failure or a thyroid problem. There is no history of sleep apnea.  Hyperlipidemia  This is a chronic problem. The current episode started more than 1 year ago. The problem is uncontrolled. Recent lipid tests were reviewed and are high. Exacerbating diseases include obesity. Associated symptoms include myalgias. Pertinent negatives include no shortness of breath. Current antihyperlipidemic treatment includes statins. The current treatment provides mild improvement of lipids. There are no compliance problems.  Risk factors for coronary artery disease include dyslipidemia and hypertension.  Depression       The patient presents with depression.  This is a chronic problem.  The current episode started more than 1 year ago.   The onset quality is gradual.   The problem has been waxing and waning since onset.  Associated symptoms include myalgias.  Associated symptoms include no helplessness, no hopelessness, does not have insomnia, no restlessness and not sad.  Past treatments include SSRIs - Selective serotonin reuptake inhibitors.  Past medical history includes depression.     Pertinent negatives include no thyroid problem and no anxiety. Anxiety    Presents for follow-up visit. Patient reports no compulsions, depressed mood, excessive worry, insomnia, irritability, nervous/anxious behavior, obsessions, palpitations, restlessness or shortness of breath. Symptoms occur rarely. The severity of symptoms is mild. The symptoms are aggravated by work stress. The quality of sleep is good. Nighttime awakenings: none.   Her past medical history is significant for anxiety/panic attacks and depression. Past treatments include lifestyle changes and SSRIs. The treatment provided significant relief. Compliance with prior treatments has been good. Compliance with medications is 76-100%.      Review of Systems  Constitutional: Negative.  Negative for irritability.  HENT: Negative.  Negative for postnasal drip and rhinorrhea.   Eyes: Negative.  Negative for blurred vision.  Respiratory: Negative.  Negative for shortness of breath.   Cardiovascular: Negative.  Negative for palpitations.  Gastrointestinal: Negative.   Endocrine: Negative.   Genitourinary: Negative.   Musculoskeletal: Positive for myalgias.  Neurological: Negative.   Hematological: Negative.   Psychiatric/Behavioral: Positive for depression. Negative for agitation, dysphoric mood and sleep disturbance. The patient is not nervous/anxious and does not have insomnia.   All other systems reviewed and are negative.      Objective:   Physical Exam  Constitutional: She is oriented to person, place, and time. She appears well-developed and well-nourished. No distress.  HENT:  Head: Normocephalic and atraumatic.  Eyes: Pupils are equal, round, and reactive to light.  Neck: Normal range of motion. Neck supple. No thyromegaly present.  Cardiovascular: Normal rate, regular rhythm, normal heart sounds and intact distal pulses.   No murmur heard. Pulmonary/Chest: Effort normal and breath sounds normal. No respiratory distress. She has no wheezes. Right breast exhibits no inverted nipple, no  mass, no nipple discharge, no skin  change and no tenderness. Left breast exhibits no inverted nipple, no mass, no nipple discharge, no skin change and no tenderness. Breasts are symmetrical.  Abdominal: Soft. Bowel sounds are normal. She exhibits no distension. There is no tenderness.  Genitourinary: Vagina normal.  Genitourinary Comments: Bimanual exam- no adnexal masses or tenderness, ovaries nonpalpable   Cervix parous and pink- No discharge   Musculoskeletal: Normal range of motion. She exhibits no edema or tenderness.  Neurological: She is alert and oriented to person, place, and time. She has normal reflexes. No cranial nerve deficit.  Skin: Skin is warm and dry.  Psychiatric: She has a normal mood and affect. Her behavior is normal. Judgment and thought content normal.  Vitals reviewed.   BP 139/80   Pulse 70   Temp 98 F (36.7 C) (Oral)   Ht '5\' 3"'  (1.6 m)   Wt 149 lb 6.4 oz (67.8 kg)   LMP 08/18/2009   BMI 26.47 kg/m        Assessment & Plan:  1. Essential hypertension - CMP14+EGFR - CBC with Differential/Platelet  2. Current smoker - CMP14+EGFR - CBC with Differential/Platelet  3. Moderate episode of recurrent major depressive disorder (HCC) - CMP14+EGFR - CBC with Differential/Platelet  4. Vitamin D deficiency - CMP14+EGFR - CBC with Differential/Platelet - VITAMIN D 25 Hydroxy (Vit-D Deficiency, Fractures)  5. Hyperlipidemia, unspecified hyperlipidemia type - CMP14+EGFR - Lipid panel - CBC with Differential/Platelet  6. GAD (generalized anxiety disorder) - CMP14+EGFR - CBC with Differential/Platelet  7. Gynecologic exam normal - Urinalysis, Complete - CMP14+EGFR - CBC with Differential/Platelet - Pap IG w/ reflex to HPV when ASC-U  8. Annual physical exam - CMP14+EGFR - Lipid panel - CBC with Differential/Platelet - VITAMIN D 25 Hydroxy (Vit-D Deficiency, Fractures) - Thyroid Panel With TSH - Pap IG w/ reflex to HPV when ASC-U   Continue  all meds Labs pending Health Maintenance reviewed Diet and exercise encouraged RTO 6 months  Evelina Dun, FNP

## 2016-02-28 LAB — CBC WITH DIFFERENTIAL/PLATELET
BASOS: 0 %
Basophils Absolute: 0 10*3/uL (ref 0.0–0.2)
EOS (ABSOLUTE): 0.1 10*3/uL (ref 0.0–0.4)
EOS: 1 %
HEMATOCRIT: 42.4 % (ref 34.0–46.6)
HEMOGLOBIN: 13.9 g/dL (ref 11.1–15.9)
IMMATURE GRANULOCYTES: 0 %
Immature Grans (Abs): 0 10*3/uL (ref 0.0–0.1)
LYMPHS ABS: 1.7 10*3/uL (ref 0.7–3.1)
Lymphs: 34 %
MCH: 31.2 pg (ref 26.6–33.0)
MCHC: 32.8 g/dL (ref 31.5–35.7)
MCV: 95 fL (ref 79–97)
MONOCYTES: 9 %
MONOS ABS: 0.4 10*3/uL (ref 0.1–0.9)
NEUTROS PCT: 56 %
Neutrophils Absolute: 2.7 10*3/uL (ref 1.4–7.0)
Platelets: 218 10*3/uL (ref 150–379)
RBC: 4.45 x10E6/uL (ref 3.77–5.28)
RDW: 14 % (ref 12.3–15.4)
WBC: 4.9 10*3/uL (ref 3.4–10.8)

## 2016-02-28 LAB — LIPID PANEL
Chol/HDL Ratio: 4 ratio units (ref 0.0–4.4)
Cholesterol, Total: 261 mg/dL — ABNORMAL HIGH (ref 100–199)
HDL: 65 mg/dL (ref >39)
LDL Calculated: 156 mg/dL — ABNORMAL HIGH (ref 0–99)
Triglycerides: 199 mg/dL — ABNORMAL HIGH (ref 0–149)
VLDL CHOLESTEROL CAL: 40 mg/dL (ref 5–40)

## 2016-02-28 LAB — CMP14+EGFR
A/G RATIO: 2.3 — AB (ref 1.2–2.2)
ALK PHOS: 74 IU/L (ref 39–117)
ALT: 20 IU/L (ref 0–32)
AST: 22 IU/L (ref 0–40)
Albumin: 4.8 g/dL (ref 3.5–5.5)
BILIRUBIN TOTAL: 0.2 mg/dL (ref 0.0–1.2)
BUN/Creatinine Ratio: 21 (ref 9–23)
BUN: 13 mg/dL (ref 6–24)
CHLORIDE: 102 mmol/L (ref 96–106)
CO2: 23 mmol/L (ref 18–29)
Calcium: 9.3 mg/dL (ref 8.7–10.2)
Creatinine, Ser: 0.62 mg/dL (ref 0.57–1.00)
GFR calc non Af Amer: 107 mL/min/{1.73_m2} (ref >59)
GFR, EST AFRICAN AMERICAN: 123 mL/min/{1.73_m2} (ref >59)
GLUCOSE: 80 mg/dL (ref 65–99)
Globulin, Total: 2.1 g/dL (ref 1.5–4.5)
POTASSIUM: 4.7 mmol/L (ref 3.5–5.2)
Sodium: 141 mmol/L (ref 134–144)
Total Protein: 6.9 g/dL (ref 6.0–8.5)

## 2016-02-28 LAB — THYROID PANEL WITH TSH
Free Thyroxine Index: 1.4 (ref 1.2–4.9)
T3 Uptake Ratio: 28 % (ref 24–39)
T4 TOTAL: 5.1 ug/dL (ref 4.5–12.0)
TSH: 1.27 u[IU]/mL (ref 0.450–4.500)

## 2016-02-28 LAB — VITAMIN D 25 HYDROXY (VIT D DEFICIENCY, FRACTURES): Vit D, 25-Hydroxy: 37.9 ng/mL (ref 30.0–100.0)

## 2016-03-01 ENCOUNTER — Other Ambulatory Visit: Payer: Self-pay | Admitting: Family

## 2016-03-01 ENCOUNTER — Telehealth: Payer: Self-pay | Admitting: Family

## 2016-03-01 LAB — PAP IG W/ RFLX HPV ASCU: PAP Smear Comment: 0

## 2016-03-01 NOTE — Telephone Encounter (Signed)
Pt notified of lab results Pt had not been taking Lipitor regularly She will try diet and meds Verbalizes understanding

## 2016-03-03 ENCOUNTER — Other Ambulatory Visit: Payer: Self-pay | Admitting: Family

## 2016-03-03 DIAGNOSIS — I10 Essential (primary) hypertension: Secondary | ICD-10-CM

## 2016-03-03 DIAGNOSIS — E559 Vitamin D deficiency, unspecified: Secondary | ICD-10-CM

## 2016-03-06 MED ORDER — LISINOPRIL-HYDROCHLOROTHIAZIDE 20-12.5 MG PO TABS: 1.0000 | ORAL_TABLET | Freq: Every day | ORAL | 1 refills | Status: DC

## 2016-03-06 MED ORDER — VITAMIN D (ERGOCALCIFEROL) 1.25 MG (50000 UNIT) PO CAPS: 50000.0000 [IU] | ORAL_CAPSULE | ORAL | 0 refills | Status: DC

## 2016-04-24 ENCOUNTER — Other Ambulatory Visit: Payer: Self-pay | Admitting: Family

## 2016-04-24 DIAGNOSIS — F411 Generalized anxiety disorder: Secondary | ICD-10-CM

## 2016-04-24 DIAGNOSIS — F32A Depression, unspecified: Secondary | ICD-10-CM

## 2016-04-24 DIAGNOSIS — F329 Major depressive disorder, single episode, unspecified: Secondary | ICD-10-CM

## 2016-05-02 DIAGNOSIS — M722 Plantar fascial fibromatosis: Secondary | ICD-10-CM | POA: Diagnosis not present

## 2016-05-02 DIAGNOSIS — M79671 Pain in right foot: Secondary | ICD-10-CM | POA: Diagnosis not present

## 2016-05-02 DIAGNOSIS — M79672 Pain in left foot: Secondary | ICD-10-CM | POA: Diagnosis not present

## 2016-05-08 ENCOUNTER — Other Ambulatory Visit: Payer: Self-pay | Admitting: Family

## 2016-05-11 DIAGNOSIS — S4991XA Unspecified injury of right shoulder and upper arm, initial encounter: Secondary | ICD-10-CM | POA: Diagnosis not present

## 2016-05-11 DIAGNOSIS — M79621 Pain in right upper arm: Secondary | ICD-10-CM | POA: Diagnosis not present

## 2016-05-11 DIAGNOSIS — M79601 Pain in right arm: Secondary | ICD-10-CM | POA: Diagnosis not present

## 2016-06-18 ENCOUNTER — Telehealth: Payer: Self-pay | Admitting: Family

## 2016-06-18 NOTE — Telephone Encounter (Signed)
scheduled

## 2016-07-21 DIAGNOSIS — R109 Unspecified abdominal pain: Secondary | ICD-10-CM | POA: Diagnosis not present

## 2016-07-21 DIAGNOSIS — S335XXA Sprain of ligaments of lumbar spine, initial encounter: Secondary | ICD-10-CM | POA: Diagnosis not present

## 2016-08-09 ENCOUNTER — Other Ambulatory Visit: Payer: Self-pay | Admitting: Family

## 2016-08-28 ENCOUNTER — Ambulatory Visit: Payer: BLUE CROSS/BLUE SHIELD | Admitting: Family

## 2016-08-31 ENCOUNTER — Ambulatory Visit (INDEPENDENT_AMBULATORY_CARE_PROVIDER_SITE_OTHER): Payer: BLUE CROSS/BLUE SHIELD | Admitting: Family

## 2016-08-31 ENCOUNTER — Encounter: Payer: Self-pay | Admitting: Family

## 2016-08-31 VITALS — BP 126/78 | HR 59 | Temp 97.8°F | Ht 63.0 in | Wt 135.6 lb

## 2016-08-31 DIAGNOSIS — F411 Generalized anxiety disorder: Secondary | ICD-10-CM

## 2016-08-31 DIAGNOSIS — E559 Vitamin D deficiency, unspecified: Secondary | ICD-10-CM

## 2016-08-31 DIAGNOSIS — F17200 Nicotine dependence, unspecified, uncomplicated: Secondary | ICD-10-CM

## 2016-08-31 DIAGNOSIS — E785 Hyperlipidemia, unspecified: Secondary | ICD-10-CM | POA: Diagnosis not present

## 2016-08-31 DIAGNOSIS — I10 Essential (primary) hypertension: Secondary | ICD-10-CM

## 2016-08-31 DIAGNOSIS — Z1231 Encounter for screening mammogram for malignant neoplasm of breast: Secondary | ICD-10-CM | POA: Diagnosis not present

## 2016-08-31 DIAGNOSIS — F331 Major depressive disorder, recurrent, moderate: Secondary | ICD-10-CM

## 2016-08-31 NOTE — Patient Instructions (Signed)
Fat and Cholesterol Restricted Diet High levels of fat and cholesterol in your blood may lead to various health problems, such as diseases of the heart, blood vessels, gallbladder, liver, and pancreas. Fats are concentrated sources of energy that come in various forms. Certain types of fat, including saturated fat, may be harmful in excess. Cholesterol is a substance needed by your body in small amounts. Your body makes all the cholesterol it needs. Excess cholesterol comes from the food you eat. When you have high levels of cholesterol and saturated fat in your blood, health problems can develop because the excess fat and cholesterol will gather along the walls of your blood vessels, causing them to narrow. Choosing the right foods will help you control your intake of fat and cholesterol. This will help keep the levels of these substances in your blood within normal limits and reduce your risk of disease. What is my plan? Your health care provider recommends that you:  Limit your fat intake to ______% or less of your total calories per day.  Limit the amount of cholesterol in your diet to less than _________mg per day.  Eat 20-30 grams of fiber each day.  What types of fat should I choose?  Choose healthy fats more often. Choose monounsaturated and polyunsaturated fats, such as olive and canola oil, flaxseeds, walnuts, almonds, and seeds.  Eat more omega-3 fats. Good choices include salmon, mackerel, sardines, tuna, flaxseed oil, and ground flaxseeds. Aim to eat fish at least two times a week.  Limit saturated fats. Saturated fats are primarily found in animal products, such as meats, butter, and cream. Plant sources of saturated fats include palm oil, palm kernel oil, and coconut oil.  Avoid foods with partially hydrogenated oils in them. These contain trans fats. Examples of foods that contain trans fats are stick margarine, some tub margarines, cookies, crackers, and other baked goods. What  general guidelines do I need to follow? These guidelines for healthy eating will help you control your intake of fat and cholesterol:  Check food labels carefully to identify foods with trans fats or high amounts of saturated fat.  Fill one half of your plate with vegetables and green salads.  Fill one fourth of your plate with whole grains. Look for the word "whole" as the first word in the ingredient list.  Fill one fourth of your plate with lean protein foods.  Limit fruit to two servings a day. Choose fruit instead of juice.  Eat more foods that contain fiber, such as apples, broccoli, carrots, beans, peas, and barley.  Eat more home-cooked food and less restaurant, buffet, and fast food.  Limit or avoid alcohol.  Limit foods high in starch and sugar.  Limit fried foods.  Cook foods using methods other than frying. Baking, boiling, grilling, and broiling are all great options.  Lose weight if you are overweight. Losing just 5-10% of your initial body weight can help your overall health and prevent diseases such as diabetes and heart disease.  What foods can I eat? Grains  Whole grains, such as whole wheat or whole grain breads, crackers, cereals, and pasta. Unsweetened oatmeal, bulgur, barley, quinoa, or brown rice. Corn or whole wheat flour tortillas. Vegetables  Fresh or frozen vegetables (raw, steamed, roasted, or grilled). Green salads. Fruits  All fresh, canned (in natural juice), or frozen fruits. Meats and other protein foods  Ground beef (85% or leaner), grass-fed beef, or beef trimmed of fat. Skinless chicken or turkey. Ground chicken or turkey.   Pork trimmed of fat. All fish and seafood. Eggs. Dried beans, peas, or lentils. Unsalted nuts or seeds. Unsalted canned or dry beans. Dairy  Low-fat dairy products, such as skim or 1% milk, 2% or reduced-fat cheeses, low-fat ricotta or cottage cheese, or plain low-fat yo Fats and oils  Tub margarines without trans  fats. Light or reduced-fat mayonnaise and salad dressings. Avocado. Olive, canola, sesame, or safflower oils. Natural peanut or almond butter (choose ones without added sugar and oil). The items listed above may not be a complete list of recommended foods or beverages. Contact your dietitian for more options. Foods to avoid Grains  White bread. White pasta. White rice. Cornbread. Bagels, pastries, and croissants. Crackers that contain trans fat. Vegetables  White potatoes. Corn. Creamed or fried vegetables. Vegetables in a cheese sauce. Fruits  Dried fruits. Canned fruit in light or heavy syrup. Fruit juice. Meats and other protein foods  Fatty cuts of meat. Ribs, chicken wings, bacon, sausage, bologna, salami, chitterlings, fatback, hot dogs, bratwurst, and packaged luncheon meats. Liver and organ meats. Dairy  Whole or 2% milk, cream, half-and-half, and cream cheese. Whole milk cheeses. Whole-fat or sweetened yogurt. Full-fat cheeses. Nondairy creamers and whipped toppings. Processed cheese, cheese spreads, or cheese curds. Beverages  Alcohol. Sweetened drinks (such as sodas, lemonade, and fruit drinks or punches). Fats and oils  Butter, stick margarine, lard, shortening, ghee, or bacon fat. Coconut, palm kernel, or palm oils. Sweets and desserts  Corn syrup, sugars, honey, and molasses. Candy. Jam and jelly. Syrup. Sweetened cereals. Cookies, pies, cakes, donuts, muffins, and ice cream. The items listed above may not be a complete list of foods and beverages to avoid. Contact your dietitian for more information. This information is not intended to replace advice given to you by your health care provider. Make sure you discuss any questions you have with your health care provider. Document Released: 04/09/2005 Document Revised: 04/30/2014 Document Reviewed: 07/08/2013 Elsevier Interactive Patient Education  2017 Elsevier Inc.  

## 2016-08-31 NOTE — Progress Notes (Signed)
Subjective:    Patient ID: Maria West, female    DOB: 22-Apr-1966, 51 y.o.   MRN: [id number]  PT presents to the office today for chronic follow up.  Hypertension  This is a chronic problem. The current episode started more than 1 year ago. The problem has been resolved since onset. The problem is controlled. Associated symptoms include anxiety. Pertinent negatives include no malaise/fatigue, peripheral edema or shortness of breath. The current treatment provides moderate improvement. There is no history of kidney disease, CAD/MI or heart failure.  Hyperlipidemia  This is a chronic problem. The current episode started more than 1 year ago. The problem is uncontrolled. Recent lipid tests were reviewed and are high. Factors aggravating her hyperlipidemia include smoking. Pertinent negatives include no shortness of breath. Current antihyperlipidemic treatment includes statins. The current treatment provides mild improvement of lipids. Risk factors for coronary artery disease include dyslipidemia, hypertension, post-menopausal and a sedentary lifestyle.  Depression         This is a chronic problem.  The current episode started more than 1 year ago.   The onset quality is gradual.   Associated symptoms include no decreased concentration, no helplessness, no hopelessness, no restlessness, no decreased interest and not sad.  Past treatments include SSRIs - Selective serotonin reuptake inhibitors.  Past medical history includes anxiety.   Anxiety  Presents for follow-up visit. Symptoms include excessive worry and nervous/anxious behavior. Patient reports no decreased concentration, depressed mood, irritability, panic, restlessness or shortness of breath. Symptoms occur rarely.        Review of Systems  Constitutional: Negative for irritability and malaise/fatigue.  Respiratory: Negative for shortness of breath.   Psychiatric/Behavioral: Positive for depression. Negative for decreased  concentration. The patient is nervous/anxious.   All other systems reviewed and are negative.      Objective:   Physical Exam  Constitutional: She is oriented to person, place, and time. She appears well-developed and well-nourished. No distress.  HENT:  Head: Normocephalic and atraumatic.  Right Ear: External ear normal.  Left Ear: External ear normal.  Nose: Nose normal.  Mouth/Throat: Oropharynx is clear and moist.  Eyes: Pupils are equal, round, and reactive to light.  Neck: Normal range of motion. Neck supple. No thyromegaly present.  Cardiovascular: Normal rate, regular rhythm, normal heart sounds and intact distal pulses.   No murmur heard. Pulmonary/Chest: Effort normal and breath sounds normal. No respiratory distress. She has no wheezes.  Abdominal: Soft. Bowel sounds are normal. She exhibits no distension. There is no tenderness.  Musculoskeletal: Normal range of motion. She exhibits no edema or tenderness.  Neurological: She is alert and oriented to person, place, and time.  Skin: Skin is warm and dry.  Psychiatric: She has a normal mood and affect. Her behavior is normal. Judgment and thought content normal.  Vitals reviewed.     BP 126/78   Pulse (!) 59   Temp 97.8 F (36.6 C) (Oral)   Ht '5\' 3"'$  (1.6 m)   Wt 135 lb 9.6 oz (61.5 kg)   LMP 08/18/2009   BMI 24.02 kg/m      Assessment & Plan:  1. Essential hypertension - CMP14+EGFR  2. Hyperlipidemia, unspecified hyperlipidemia type - CMP14+EGFR - Lipid panel  3. Current smoker - CMP14+EGFR  4. Moderate episode of recurrent major depressive disorder (HCC) - CMP14+EGFR  5. GAD (generalized anxiety disorder) - CMP14+EGFR  6. Vitamin D deficiency - CMP14+EGFR   Continue all meds Labs pending Health Maintenance reviewed Diet and  exercise encouraged RTO 6 months   Evelina Dun, FNP

## 2016-09-01 LAB — CMP14+EGFR
ALBUMIN: 4.7 g/dL (ref 3.5–5.5)
ALK PHOS: 63 IU/L (ref 39–117)
ALT: 30 IU/L (ref 0–32)
AST: 24 IU/L (ref 0–40)
Albumin/Globulin Ratio: 2.2 (ref 1.2–2.2)
BUN / CREAT RATIO: 21 (ref 9–23)
BUN: 13 mg/dL (ref 6–24)
CHLORIDE: 104 mmol/L (ref 96–106)
CO2: 23 mmol/L (ref 18–29)
Calcium: 9.6 mg/dL (ref 8.7–10.2)
Creatinine, Ser: 0.62 mg/dL (ref 0.57–1.00)
GFR calc Af Amer: 123 mL/min/{1.73_m2} (ref >59)
GFR calc non Af Amer: 107 mL/min/{1.73_m2} (ref >59)
GLUCOSE: 74 mg/dL (ref 65–99)
Globulin, Total: 2.1 g/dL (ref 1.5–4.5)
POTASSIUM: 4.4 mmol/L (ref 3.5–5.2)
SODIUM: 143 mmol/L (ref 134–144)
Total Protein: 6.8 g/dL (ref 6.0–8.5)

## 2016-09-01 LAB — LIPID PANEL
Chol/HDL Ratio: 2.7 ratio (ref 0.0–4.4)
Cholesterol, Total: 192 mg/dL (ref 100–199)
HDL: 70 mg/dL (ref >39)
LDL Calculated: 100 mg/dL — ABNORMAL HIGH (ref 0–99)
Triglycerides: 108 mg/dL (ref 0–149)
VLDL CHOLESTEROL CAL: 22 mg/dL (ref 5–40)

## 2016-09-11 ENCOUNTER — Other Ambulatory Visit: Payer: Self-pay | Admitting: Family

## 2016-09-24 ENCOUNTER — Other Ambulatory Visit: Payer: Self-pay | Admitting: Family

## 2016-09-24 DIAGNOSIS — E559 Vitamin D deficiency, unspecified: Secondary | ICD-10-CM

## 2016-11-05 ENCOUNTER — Other Ambulatory Visit: Payer: Self-pay | Admitting: Family

## 2016-11-05 DIAGNOSIS — E559 Vitamin D deficiency, unspecified: Secondary | ICD-10-CM

## 2016-11-15 ENCOUNTER — Telehealth: Payer: Self-pay | Admitting: Family

## 2016-11-15 MED ORDER — LOSARTAN POTASSIUM-HCTZ 50-12.5 MG PO TABS: 1.0000 | ORAL_TABLET | Freq: Every day | ORAL | 3 refills | Status: DC

## 2016-11-15 NOTE — Telephone Encounter (Signed)
Dry non productive cough she has had for months she feels is coming from her atorvastatin which she would like changed to another medication, she has not stopped this yet though Eucerin cream has helped with the eczema that she has from the atorvastatin

## 2016-11-15 NOTE — Telephone Encounter (Signed)
Cough is usually an adverse effect of Lisinopril or Lipitor. PT stop Lisinopril and start Losartan. Pt needs to follow up to recheck BP in 2-4 weeks.

## 2016-11-15 NOTE — Telephone Encounter (Signed)
[facility] [place]

## 2016-11-16 ENCOUNTER — Other Ambulatory Visit: Payer: Self-pay | Admitting: Family

## 2016-11-16 MED ORDER — ATORVASTATIN CALCIUM 40 MG PO TABS: 40.0000 mg | ORAL_TABLET | Freq: Every day | ORAL | 3 refills | Status: DC

## 2016-11-16 NOTE — Telephone Encounter (Signed)
Pt aware med sent in

## 2016-11-20 ENCOUNTER — Other Ambulatory Visit: Payer: Self-pay | Admitting: Family

## 2016-11-20 DIAGNOSIS — E559 Vitamin D deficiency, unspecified: Secondary | ICD-10-CM

## 2016-11-21 MED ORDER — LOSARTAN POTASSIUM-HCTZ 50-12.5 MG PO TABS: 1.0000 | ORAL_TABLET | Freq: Every day | ORAL | 0 refills | Status: DC

## 2016-11-21 MED ORDER — VITAMIN D (ERGOCALCIFEROL) 1.25 MG (50000 UNIT) PO CAPS: 50000.0000 [IU] | ORAL_CAPSULE | ORAL | 0 refills | Status: DC

## 2016-12-05 ENCOUNTER — Other Ambulatory Visit: Payer: Self-pay | Admitting: Family

## 2016-12-05 DIAGNOSIS — F329 Major depressive disorder, single episode, unspecified: Secondary | ICD-10-CM

## 2016-12-05 DIAGNOSIS — F32A Depression, unspecified: Secondary | ICD-10-CM

## 2016-12-05 DIAGNOSIS — E559 Vitamin D deficiency, unspecified: Secondary | ICD-10-CM

## 2016-12-05 DIAGNOSIS — F411 Generalized anxiety disorder: Secondary | ICD-10-CM

## 2016-12-07 MED ORDER — CITALOPRAM HYDROBROMIDE 20 MG PO TABS: 20.0000 mg | ORAL_TABLET | Freq: Every day | ORAL | 0 refills | Status: DC

## 2016-12-07 MED ORDER — VITAMIN D (ERGOCALCIFEROL) 1.25 MG (50000 UNIT) PO CAPS: 50000.0000 [IU] | ORAL_CAPSULE | ORAL | 0 refills | Status: DC

## 2016-12-07 MED ORDER — ATORVASTATIN CALCIUM 40 MG PO TABS: 40.0000 mg | ORAL_TABLET | Freq: Every day | ORAL | 0 refills | Status: DC

## 2016-12-07 MED ORDER — LOSARTAN POTASSIUM-HCTZ 50-12.5 MG PO TABS: 1.0000 | ORAL_TABLET | Freq: Every day | ORAL | 0 refills | Status: DC

## 2016-12-07 NOTE — Telephone Encounter (Signed)
Pt changing pharmacy's d/t no insurance Aware she needs to be seen in the next month

## 2017-03-12 ENCOUNTER — Telehealth: Payer: Self-pay | Admitting: Family

## 2017-03-12 NOTE — Telephone Encounter (Signed)
Spoke with patient and explained to her that pharmacy blood pressure machines can sometimes be inaccurate because they are not calibrated very often.  She will drop by tomorrow and have it checked by triage nurse.  Explained to her if it was elevated she would need to be seen by someone.

## 2017-03-13 ENCOUNTER — Ambulatory Visit (INDEPENDENT_AMBULATORY_CARE_PROVIDER_SITE_OTHER): Payer: Self-pay | Admitting: *Deleted

## 2017-03-13 DIAGNOSIS — F32A Depression, unspecified: Secondary | ICD-10-CM

## 2017-03-13 DIAGNOSIS — F329 Major depressive disorder, single episode, unspecified: Secondary | ICD-10-CM

## 2017-03-13 DIAGNOSIS — F411 Generalized anxiety disorder: Secondary | ICD-10-CM

## 2017-03-13 MED ORDER — ATORVASTATIN CALCIUM 40 MG PO TABS: 40.0000 mg | ORAL_TABLET | Freq: Every day | ORAL | 0 refills | Status: DC

## 2017-03-13 MED ORDER — CITALOPRAM HYDROBROMIDE 20 MG PO TABS: 20.0000 mg | ORAL_TABLET | Freq: Every day | ORAL | 0 refills | Status: DC

## 2017-03-13 MED ORDER — LOSARTAN POTASSIUM-HCTZ 50-12.5 MG PO TABS: 1.0000 | ORAL_TABLET | Freq: Every day | ORAL | 0 refills | Status: DC

## 2017-03-13 NOTE — Progress Notes (Signed)
Pt here for BP check and med refills Pt has no insurance Per [employer] med refills until January when pt will have insurance

## 2017-03-15 ENCOUNTER — Telehealth: Payer: Self-pay | Admitting: Family

## 2017-03-15 MED ORDER — IRBESARTAN-HYDROCHLOROTHIAZIDE 150-12.5 MG PO TABS: 1.0000 | ORAL_TABLET | Freq: Every day | ORAL | 1 refills | Status: DC

## 2017-03-15 NOTE — Telephone Encounter (Signed)
Spoke with Dr Oswaldo DoneVincent because Irbesartan doesn't come in 50mg  so she said the comparible dose is 150mg . Rx sent over as for Irbesartan/HCTZ 150/12.5 and pt is aware.

## 2017-03-15 NOTE — Telephone Encounter (Signed)
Irbesartan 50/12.5, 1 daily, #30

## 2017-03-15 NOTE — Telephone Encounter (Signed)
losartan-hydrochlorothiazide (HYZAAR) 50-12.5 MG tablet is on back order needs something else called into [facility] in [place].

## 2017-03-22 ENCOUNTER — Telehealth: Payer: Self-pay | Admitting: Family

## 2017-03-22 NOTE — Telephone Encounter (Signed)
Please advise on medication change

## 2017-03-22 NOTE — Telephone Encounter (Signed)
States new   irbesartan-hydrochlorothiazide (AVALIDE) 150-12.5 MG tablet  Has been recalled wants something else called into [facility] [place]. Please advise.

## 2017-03-22 NOTE — Telephone Encounter (Signed)
I spoke to patient's pharmacist and there is not been any recall on the replacement medication, Avalide.  There was a voluntary recall earlier this year for individual hydrochlorothiazide 12.5 mg capsules but as far as she knows there is not been any issues with a combination pill.  It appears that patient pick this medication up on the 24th.  Please inform her of this information.

## 2017-03-22 NOTE — Telephone Encounter (Signed)
Pt understands.

## 2017-04-29 ENCOUNTER — Other Ambulatory Visit: Payer: Self-pay | Admitting: Family

## 2017-04-29 DIAGNOSIS — E559 Vitamin D deficiency, unspecified: Secondary | ICD-10-CM

## 2017-06-25 ENCOUNTER — Ambulatory Visit: Payer: Self-pay | Admitting: Family

## 2017-06-28 ENCOUNTER — Ambulatory Visit: Payer: BLUE CROSS/BLUE SHIELD | Admitting: Family

## 2017-06-28 ENCOUNTER — Encounter: Payer: Self-pay | Admitting: Family

## 2017-06-28 VITALS — BP 137/77 | HR 69 | Temp 99.0°F | Ht 63.0 in | Wt 152.8 lb

## 2017-06-28 DIAGNOSIS — F411 Generalized anxiety disorder: Secondary | ICD-10-CM | POA: Diagnosis not present

## 2017-06-28 DIAGNOSIS — F17200 Nicotine dependence, unspecified, uncomplicated: Secondary | ICD-10-CM

## 2017-06-28 DIAGNOSIS — E785 Hyperlipidemia, unspecified: Secondary | ICD-10-CM

## 2017-06-28 DIAGNOSIS — I10 Essential (primary) hypertension: Secondary | ICD-10-CM | POA: Diagnosis not present

## 2017-06-28 DIAGNOSIS — E559 Vitamin D deficiency, unspecified: Secondary | ICD-10-CM

## 2017-06-28 DIAGNOSIS — F331 Major depressive disorder, recurrent, moderate: Secondary | ICD-10-CM | POA: Diagnosis not present

## 2017-06-28 NOTE — Patient Instructions (Signed)

## 2017-06-28 NOTE — Progress Notes (Signed)
Subjective:    Patient ID: Maria West, female    DOB: [birth year], 52 y.o.   MRN: [id number]  Pt presents to the office today for chronic follow up.  Hypertension  This is a chronic problem. The current episode started more than 1 year ago. The problem has been resolved since onset. The problem is controlled. Associated symptoms include anxiety. Pertinent negatives include no malaise/fatigue, peripheral edema or shortness of breath. Risk factors for coronary artery disease include dyslipidemia and sedentary lifestyle. The current treatment provides moderate improvement. There is no history of kidney disease, CAD/MI, CVA or heart failure.  Anxiety  Presents for follow-up visit. Symptoms include excessive worry, irritability and nervous/anxious behavior. Patient reports no depressed mood, restlessness or shortness of breath. Symptoms occur occasionally. The quality of sleep is good.    Depression         This is a chronic problem.  The current episode started more than 1 year ago.   The onset quality is gradual.   The problem occurs intermittently.  The problem has been waxing and waning since onset.  Associated symptoms include irritable and sad.  Associated symptoms include no hopelessness and no restlessness.  Past treatments include SSRIs - Selective serotonin reuptake inhibitors.  Past medical history includes anxiety.   Hyperlipidemia  This is a chronic problem. The current episode started more than 1 year ago. The problem is controlled. Recent lipid tests were reviewed and are normal. Pertinent negatives include no shortness of breath. Current antihyperlipidemic treatment includes statins. The current treatment provides moderate improvement of lipids. Risk factors for coronary artery disease include hypertension, dyslipidemia and a sedentary lifestyle.      Review of Systems  Constitutional: Positive for irritability. Negative for malaise/fatigue.  Respiratory: Negative for shortness  of breath.   Psychiatric/Behavioral: Positive for depression. The patient is nervous/anxious.   All other systems reviewed and are negative.      Objective:   Physical Exam  Constitutional: She is oriented to person, place, and time. She appears well-developed and well-nourished. She is irritable. No distress.  HENT:  Head: Normocephalic and atraumatic.  Right Ear: External ear normal.  Left Ear: External ear normal.  Nose: Nose normal.  Mouth/Throat: Oropharynx is clear and moist.  Eyes: Pupils are equal, round, and reactive to light.  Neck: Normal range of motion. Neck supple. No thyromegaly present.  Cardiovascular: Normal rate, regular rhythm, normal heart sounds and intact distal pulses.  No murmur heard. Pulmonary/Chest: Effort normal and breath sounds normal. No respiratory distress. She has no wheezes.  Abdominal: Soft. Bowel sounds are normal. She exhibits no distension. There is no tenderness.  Musculoskeletal: Normal range of motion. She exhibits no edema or tenderness.  Neurological: She is alert and oriented to person, place, and time.  Skin: Skin is warm and dry.  Psychiatric: She has a normal mood and affect. Her behavior is normal. Judgment and thought content normal.  Vitals reviewed.     BP 137/77   Pulse 69   Temp 99 F (37.2 C) (Oral)   Ht '5\' 3"'  (1.6 m)   Wt 152 lb 12.8 oz (69.3 kg)   LMP 08/18/2009   BMI 27.07 kg/m      Assessment & Plan:  1. Essential hypertension - CMP14+EGFR  2. Moderate episode of recurrent major depressive disorder (HCC) - CMP14+EGFR  3. Hyperlipidemia, unspecified hyperlipidemia type - CMP14+EGFR - Lipid panel  4. GAD (generalized anxiety disorder) - CMP14+EGFR  5. Current smoker Smoking cessation discussed  -  CMP14+EGFR  6. Vitamin D deficiency - CMP14+EGFR - VITAMIN D 25 Hydroxy (Vit-D Deficiency, Fractures)   Continue all meds Labs pending Health Maintenance reviewed Diet and exercise encouraged RTO 6  months   Evelina Dun, FNP

## 2017-06-29 LAB — CMP14+EGFR
A/G RATIO: 1.8 (ref 1.2–2.2)
ALBUMIN: 4.4 g/dL (ref 3.5–5.5)
ALT: 31 IU/L (ref 0–32)
AST: 31 IU/L (ref 0–40)
Alkaline Phosphatase: 78 IU/L (ref 39–117)
BILIRUBIN TOTAL: 0.3 mg/dL (ref 0.0–1.2)
BUN / CREAT RATIO: 16 (ref 9–23)
BUN: 12 mg/dL (ref 6–24)
CHLORIDE: 99 mmol/L (ref 96–106)
CO2: 23 mmol/L (ref 20–29)
Calcium: 9.1 mg/dL (ref 8.7–10.2)
Creatinine, Ser: 0.73 mg/dL (ref 0.57–1.00)
GFR calc Af Amer: 112 mL/min/{1.73_m2} (ref >59)
GFR calc non Af Amer: 97 mL/min/{1.73_m2} (ref >59)
Globulin, Total: 2.5 g/dL (ref 1.5–4.5)
Glucose: 85 mg/dL (ref 65–99)
POTASSIUM: 4 mmol/L (ref 3.5–5.2)
Sodium: 137 mmol/L (ref 134–144)
TOTAL PROTEIN: 6.9 g/dL (ref 6.0–8.5)

## 2017-06-29 LAB — VITAMIN D 25 HYDROXY (VIT D DEFICIENCY, FRACTURES): Vit D, 25-Hydroxy: 52.5 ng/mL (ref 30.0–100.0)

## 2017-06-29 LAB — LIPID PANEL
Chol/HDL Ratio: 2.9 ratio (ref 0.0–4.4)
Cholesterol, Total: 179 mg/dL (ref 100–199)
HDL: 61 mg/dL (ref >39)
LDL Calculated: 98 mg/dL (ref 0–99)
Triglycerides: 99 mg/dL (ref 0–149)
VLDL Cholesterol Cal: 20 mg/dL (ref 5–40)

## 2017-07-01 ENCOUNTER — Other Ambulatory Visit: Payer: Self-pay | Admitting: Family

## 2017-07-01 DIAGNOSIS — F411 Generalized anxiety disorder: Secondary | ICD-10-CM

## 2017-07-01 DIAGNOSIS — F329 Major depressive disorder, single episode, unspecified: Secondary | ICD-10-CM

## 2017-07-01 DIAGNOSIS — F32A Depression, unspecified: Secondary | ICD-10-CM

## 2017-07-10 DIAGNOSIS — M7712 Lateral epicondylitis, left elbow: Secondary | ICD-10-CM | POA: Diagnosis not present

## 2017-07-10 DIAGNOSIS — M79602 Pain in left arm: Secondary | ICD-10-CM | POA: Diagnosis not present

## 2017-07-13 ENCOUNTER — Other Ambulatory Visit: Payer: Self-pay | Admitting: Family

## 2017-08-14 ENCOUNTER — Other Ambulatory Visit: Payer: Self-pay | Admitting: Family

## 2017-08-14 DIAGNOSIS — E559 Vitamin D deficiency, unspecified: Secondary | ICD-10-CM

## 2017-08-26 ENCOUNTER — Other Ambulatory Visit: Payer: Self-pay | Admitting: Family

## 2017-08-26 DIAGNOSIS — F411 Generalized anxiety disorder: Secondary | ICD-10-CM

## 2017-08-26 DIAGNOSIS — F32A Depression, unspecified: Secondary | ICD-10-CM

## 2017-08-26 DIAGNOSIS — F329 Major depressive disorder, single episode, unspecified: Secondary | ICD-10-CM

## 2017-09-03 ENCOUNTER — Telehealth: Payer: Self-pay | Admitting: Family

## 2017-09-03 DIAGNOSIS — F411 Generalized anxiety disorder: Secondary | ICD-10-CM

## 2017-09-03 DIAGNOSIS — F329 Major depressive disorder, single episode, unspecified: Secondary | ICD-10-CM

## 2017-09-03 DIAGNOSIS — F32A Depression, unspecified: Secondary | ICD-10-CM

## 2017-09-03 MED ORDER — CITALOPRAM HYDROBROMIDE 20 MG PO TABS: 20.0000 mg | ORAL_TABLET | Freq: Every day | ORAL | 1 refills | Status: DC

## 2017-09-03 NOTE — Telephone Encounter (Signed)
Prescription sent to pharmacy.

## 2017-09-03 NOTE — Telephone Encounter (Signed)
Aware, she may have to pay cash out of pocket but needs the medicine. Please send in another script.

## 2017-09-03 NOTE — Telephone Encounter (Signed)
Line busy x 2

## 2017-09-16 ENCOUNTER — Other Ambulatory Visit: Payer: Self-pay | Admitting: Family

## 2017-09-20 ENCOUNTER — Ambulatory Visit: Payer: BLUE CROSS/BLUE SHIELD | Admitting: Family

## 2017-09-20 ENCOUNTER — Encounter: Payer: Self-pay | Admitting: Family

## 2017-09-20 VITALS — BP 133/87 | HR 74 | Temp 99.1°F | Ht 63.0 in | Wt 142.0 lb

## 2017-09-20 DIAGNOSIS — L301 Dyshidrosis [pompholyx]: Secondary | ICD-10-CM

## 2017-09-20 DIAGNOSIS — F17200 Nicotine dependence, unspecified, uncomplicated: Secondary | ICD-10-CM

## 2017-09-20 DIAGNOSIS — F331 Major depressive disorder, recurrent, moderate: Secondary | ICD-10-CM | POA: Diagnosis not present

## 2017-09-20 DIAGNOSIS — F411 Generalized anxiety disorder: Secondary | ICD-10-CM | POA: Diagnosis not present

## 2017-09-20 MED ORDER — CITALOPRAM HYDROBROMIDE 40 MG PO TABS: 40.0000 mg | ORAL_TABLET | Freq: Every day | ORAL | 5 refills | Status: DC

## 2017-09-20 MED ORDER — TRIAMCINOLONE ACETONIDE 0.5 % EX OINT: 1.0000 "application " | TOPICAL_OINTMENT | Freq: Two times a day (BID) | CUTANEOUS | 0 refills | Status: DC

## 2017-09-20 NOTE — Progress Notes (Signed)
Subjective:    Patient ID: Maria West, female    DOB: 1966/02/25, 52 y.o.   MRN: [id number]  No chief complaint on file.   Rash  This is a new problem. The current episode started 1 to 4 weeks ago. The problem is unchanged. Location: hands. The rash is characterized by blistering. Associated with: new lotion. Pertinent negatives include no cough, eye pain, shortness of breath or sore throat. Past treatments include topical steroids. The treatment provided no relief.  Anxiety  Presents for follow-up visit. Symptoms include excessive worry, irritability, nervous/anxious behavior and restlessness. Patient reports no shortness of breath. Symptoms occur most days. The severity of symptoms is mild.    Depression         This is a chronic problem.  The current episode started more than 1 year ago.   The onset quality is gradual.   The problem occurs intermittently.  The problem has been waxing and waning since onset.  Associated symptoms include irritable, restlessness, decreased interest and sad.  Associated symptoms include no helplessness and no hopelessness.  Past treatments include SSRIs - Selective serotonin reuptake inhibitors.  Compliance with treatment is good.  Past medical history includes anxiety.       Review of Systems  Constitutional: Positive for irritability.  HENT: Negative for sore throat.   Eyes: Negative for pain.  Respiratory: Negative for cough and shortness of breath.   Skin: Positive for rash.  Psychiatric/Behavioral: Positive for depression. The patient is nervous/anxious.   All other systems reviewed and are negative.      Objective:   Physical Exam  Constitutional: She is oriented to person, place, and time. She appears well-developed and well-nourished. She is irritable. No distress.  HENT:  Head: Normocephalic and atraumatic.  Right Ear: External ear normal.  Mouth/Throat: Oropharynx is clear and moist.  Eyes: Pupils are equal, round, and reactive to  light.  Neck: Normal range of motion. Neck supple. No thyromegaly present.  Cardiovascular: Normal rate, regular rhythm, normal heart sounds and intact distal pulses.  No murmur heard. Pulmonary/Chest: Effort normal and breath sounds normal. No respiratory distress. She has no wheezes.  Abdominal: Soft. Bowel sounds are normal. She exhibits no distension. There is no tenderness.  Musculoskeletal: Normal range of motion. She exhibits no edema or tenderness.  Neurological: She is alert and oriented to person, place, and time. She has normal reflexes. No cranial nerve deficit.  Skin: Skin is warm and dry. Rash noted. Rash is vesicular (scattered vesicule on left palm, right plam, left index, middle, and right pinky).  Psychiatric: She has a normal mood and affect. Her behavior is normal. Judgment and thought content normal.  Vitals reviewed.     BP 133/87   Pulse 74   Temp 99.1 F (37.3 C) (Oral)   Ht  (1.6 m)   Wt 142 lb (64.4 kg)   LMP 08/18/2009   BMI 25.15 kg/m      Assessment & Plan:  Maria West comes in today with chief complaint of Rash and Medical Management of Chronic Issues   Diagnosis and orders addressed:  1. Moderate episode of recurrent major depressive disorder (HCC) Will increase Celexa to 40 mg from 20 mg  Stress management discussed RTO in 6 weeks  - citalopram (CELEXA) 40 MG tablet; Take 1 tablet (40 mg total) by mouth daily.  Dispense: 90 tablet; Refill: 5  2. Current smoker Smoking cessation discussed  3. GAD (generalized anxiety disorder) - citalopram (CELEXA) 40  MG tablet; Take 1 tablet (40 mg total) by mouth daily.  Dispense: 90 tablet; Refill: 5  4. Dyshidrotic eczema Do not scratch Avoid irritants  Mild soaps - triamcinolone ointment (KENALOG) 0.5 %; Apply 1 application topically 2 (two) times daily.  Dispense: 30 g; Refill: 0   Labs pending Health Maintenance reviewed- Pt to schedule CPE and mammogram in 6 weeks  Diet and exercise  encouraged  Follow up plan: 6 weeks   Jannifer Rodney, FNP

## 2017-10-07 ENCOUNTER — Telehealth: Payer: Self-pay | Admitting: Family

## 2017-10-07 DIAGNOSIS — L301 Dyshidrosis [pompholyx]: Secondary | ICD-10-CM

## 2017-10-07 MED ORDER — TRIAMCINOLONE ACETONIDE 0.5 % EX OINT: 1.0000 "application " | TOPICAL_OINTMENT | Freq: Two times a day (BID) | CUTANEOUS | 0 refills | Status: DC

## 2017-10-07 NOTE — Telephone Encounter (Signed)
Refill sent.

## 2017-10-09 ENCOUNTER — Telehealth: Payer: Self-pay | Admitting: Family

## 2017-10-09 ENCOUNTER — Other Ambulatory Visit: Payer: Self-pay | Admitting: *Deleted

## 2017-10-09 MED ORDER — TRIAMCINOLONE ACETONIDE 0.5 % EX CREA: 1.0000 "application " | TOPICAL_CREAM | Freq: Two times a day (BID) | CUTANEOUS | 0 refills | Status: DC

## 2017-10-09 NOTE — Telephone Encounter (Signed)
Patient aware, script is ready, per voice mail. 

## 2017-10-14 ENCOUNTER — Telehealth: Payer: Self-pay | Admitting: *Deleted

## 2017-10-14 NOTE — Telephone Encounter (Signed)
Yes, it is ok to separate

## 2017-10-14 NOTE — Telephone Encounter (Signed)
Fax received [facility]  Losartan/HCTZ 50-12.5 mg tab Is on back order Request to separate medications Please advise

## 2017-10-15 ENCOUNTER — Other Ambulatory Visit: Payer: Self-pay | Admitting: *Deleted

## 2017-10-15 MED ORDER — LOSARTAN POTASSIUM 50 MG PO TABS: 50.0000 mg | ORAL_TABLET | Freq: Every day | ORAL | 0 refills | Status: DC

## 2017-10-15 MED ORDER — HYDROCHLOROTHIAZIDE 12.5 MG PO CAPS: 12.5000 mg | ORAL_CAPSULE | Freq: Every day | ORAL | 0 refills | Status: DC

## 2017-10-15 NOTE — Addendum Note (Signed)
Addended by: Caryl BisBOWMAN, Daviel Allegretto M on: 10/15/2017 10:02 AM   Modules accepted: Orders

## 2017-10-31 ENCOUNTER — Ambulatory Visit (INDEPENDENT_AMBULATORY_CARE_PROVIDER_SITE_OTHER): Payer: BLUE CROSS/BLUE SHIELD | Admitting: Family

## 2017-10-31 ENCOUNTER — Encounter: Payer: Self-pay | Admitting: Family

## 2017-10-31 VITALS — BP 120/66 | HR 68 | Temp 98.1°F | Ht 63.0 in | Wt 140.4 lb

## 2017-10-31 DIAGNOSIS — R5383 Other fatigue: Secondary | ICD-10-CM

## 2017-10-31 DIAGNOSIS — F331 Major depressive disorder, recurrent, moderate: Secondary | ICD-10-CM

## 2017-10-31 DIAGNOSIS — E785 Hyperlipidemia, unspecified: Secondary | ICD-10-CM | POA: Diagnosis not present

## 2017-10-31 DIAGNOSIS — I10 Essential (primary) hypertension: Secondary | ICD-10-CM

## 2017-10-31 DIAGNOSIS — F17200 Nicotine dependence, unspecified, uncomplicated: Secondary | ICD-10-CM

## 2017-10-31 DIAGNOSIS — Z0000 Encounter for general adult medical examination without abnormal findings: Secondary | ICD-10-CM | POA: Diagnosis not present

## 2017-10-31 DIAGNOSIS — E559 Vitamin D deficiency, unspecified: Secondary | ICD-10-CM

## 2017-10-31 DIAGNOSIS — Z01419 Encounter for gynecological examination (general) (routine) without abnormal findings: Secondary | ICD-10-CM | POA: Diagnosis not present

## 2017-10-31 DIAGNOSIS — F411 Generalized anxiety disorder: Secondary | ICD-10-CM

## 2017-10-31 LAB — URINALYSIS, COMPLETE
Bilirubin, UA: NEGATIVE
GLUCOSE, UA: NEGATIVE
Ketones, UA: NEGATIVE
Leukocytes, UA: NEGATIVE
NITRITE UA: NEGATIVE
PH UA: 6 (ref 5.0–7.5)
RBC UA: NEGATIVE
Specific Gravity, UA: 1.025 (ref 1.005–1.030)
UUROB: 1 mg/dL (ref 0.2–1.0)

## 2017-10-31 LAB — MICROSCOPIC EXAMINATION
Epithelial Cells (non renal): >10 /hpf — AB (ref 0–10)
RENAL EPITHEL UA: NONE SEEN /HPF

## 2017-10-31 NOTE — Patient Instructions (Signed)

## 2017-10-31 NOTE — Progress Notes (Signed)
Subjective:    Patient ID: Jawanna Dykman, female    DOB: 1965/06/15, 52 y.o.   MRN: [id number]  Chief Complaint  Patient presents with  . Gynecologic Exam   Pt presents to the office today for CPE with pap. Pt complaining of fatigue that is intermittent over the last several months.  Hypertension  This is a chronic problem. The current episode started more than 1 year ago. The problem has been resolved since onset. The problem is controlled. Associated symptoms include anxiety. Pertinent negatives include no peripheral edema or shortness of breath. Risk factors for coronary artery disease include dyslipidemia, diabetes mellitus, female gender, obesity, smoking/tobacco exposure and sedentary lifestyle. The current treatment provides moderate improvement. There is no history of kidney disease, CAD/MI or heart failure.  Hyperlipidemia  This is a chronic problem. The current episode started more than 1 year ago. The problem is controlled. Recent lipid tests were reviewed and are normal. Pertinent negatives include no shortness of breath. Current antihyperlipidemic treatment includes statins. The current treatment provides moderate improvement of lipids. Risk factors for coronary artery disease include dyslipidemia, hypertension and a sedentary lifestyle.  Depression         This is a chronic problem.  The current episode started more than 1 year ago.   The onset quality is gradual.   The problem occurs intermittently.  The problem has been waxing and waning since onset.  Associated symptoms include irritable and decreased interest.  Associated symptoms include no helplessness and no hopelessness.  Past treatments include SSRIs - Selective serotonin reuptake inhibitors.  Past medical history includes anxiety.   Anxiety  Presents for follow-up visit. Symptoms include excessive worry, irritability and nervous/anxious behavior. Patient reports no shortness of breath. Symptoms occur most days. The severity  of symptoms is moderate.        Review of Systems  Constitutional: Positive for irritability.  Respiratory: Negative for shortness of breath.   Psychiatric/Behavioral: Positive for depression. The patient is nervous/anxious.   All other systems reviewed and are negative.      Objective:   Physical Exam  Constitutional: She is oriented to person, place, and time. She appears well-developed and well-nourished. She is irritable. No distress.  HENT:  Head: Normocephalic and atraumatic.  Right Ear: External ear normal.  Left Ear: External ear normal.  Nose: Nose normal.  Mouth/Throat: Oropharynx is clear and moist.  Eyes: Pupils are equal, round, and reactive to light.  Neck: Normal range of motion. Neck supple. No thyromegaly present.  Cardiovascular: Normal rate, regular rhythm, normal heart sounds and intact distal pulses.  No murmur heard. Pulmonary/Chest: Effort normal and breath sounds normal. No respiratory distress. She has no wheezes. Right breast exhibits no inverted nipple, no mass, no nipple discharge, no skin change and no tenderness. Left breast exhibits no inverted nipple, no mass, no nipple discharge, no skin change and no tenderness.  Abdominal: Soft. Bowel sounds are normal. She exhibits no distension. There is no tenderness.  Genitourinary: Vagina normal.  Genitourinary Comments: Bimanual exam- no adnexal masses or tenderness, ovaries nonpalpable   Cervix parous and pink- No discharge   Musculoskeletal: Normal range of motion. She exhibits no edema or tenderness.  Neurological: She is alert and oriented to person, place, and time. She has normal reflexes. No cranial nerve deficit.  Skin: Skin is warm and dry.  Psychiatric: She has a normal mood and affect. Her behavior is normal. Judgment and thought content normal.  Vitals reviewed.  BP 120/66   Pulse 68   Temp 98.1 F (36.7 C) (Oral)   Ht '5\' 3"'  (1.6 m)   Wt 140 lb 6.4 oz (63.7 kg)   LMP 08/18/2009    BMI 24.87 kg/m      Assessment & Plan:  Rae Plotner comes in today with chief complaint of Gynecologic Exam   Diagnosis and orders addressed:  1. Moderate episode of recurrent major depressive disorder (HCC) - CMP14+EGFR  2. Hyperlipidemia, unspecified hyperlipidemia type - CMP14+EGFR  3. Essential hypertension - CMP14+EGFR  4. GAD (generalized anxiety disorder) - CMP14+EGFR  5. Vitamin D deficiency - CMP14+EGFR  6. Current smoker - CMP14+EGFR  7. Normal gynecologic examination - Urinalysis, Complete - CMP14+EGFR - IGP, Aptima HPV, rfx 16/18,45  8. Annual physical exam - CMP14+EGFR - Anemia Profile B - TSH - VITAMIN D 25 Hydroxy (Vit-D Deficiency, Fractures) - IGP, Aptima HPV, rfx 16/18,45  9. Fatigue, unspecified type - CMP14+EGFR - Anemia Profile B - TSH - VITAMIN D 25 Hydroxy (Vit-D Deficiency, Fractures)   Labs pending Health Maintenance reviewed, pt has mammogram scheduled  Diet and exercise encouraged  Follow up plan: 6 months    Evelina Dun, FNP

## 2017-11-01 LAB — ANEMIA PROFILE B
BASOS ABS: 0 10*3/uL (ref 0.0–0.2)
Basos: 0 %
EOS (ABSOLUTE): 0.1 10*3/uL (ref 0.0–0.4)
Eos: 2 %
Ferritin: 162 ng/mL — ABNORMAL HIGH (ref 15–150)
Folate: 8.4 ng/mL (ref >3.0)
Hematocrit: 42.5 % (ref 34.0–46.6)
Hemoglobin: 13.7 g/dL (ref 11.1–15.9)
IMMATURE GRANULOCYTES: 0 %
IRON: 93 ug/dL (ref 27–159)
Immature Grans (Abs): 0 10*3/uL (ref 0.0–0.1)
Iron Saturation: 30 % (ref 15–55)
LYMPHS: 38 %
Lymphocytes Absolute: 2.6 10*3/uL (ref 0.7–3.1)
MCH: 31.9 pg (ref 26.6–33.0)
MCHC: 32.2 g/dL (ref 31.5–35.7)
MCV: 99 fL — ABNORMAL HIGH (ref 79–97)
MONOCYTES: 10 %
Monocytes Absolute: 0.6 10*3/uL (ref 0.1–0.9)
NEUTROS PCT: 50 %
Neutrophils Absolute: 3.4 10*3/uL (ref 1.4–7.0)
Platelets: 196 10*3/uL (ref 150–450)
RBC: 4.3 x10E6/uL (ref 3.77–5.28)
RDW: 13.6 % (ref 12.3–15.4)
RETIC CT PCT: 0.9 % (ref 0.6–2.6)
TIBC: 311 ug/dL (ref 250–450)
UIBC: 218 ug/dL (ref 131–425)
Vitamin B-12: 419 pg/mL (ref 232–1245)
WBC: 6.8 10*3/uL (ref 3.4–10.8)

## 2017-11-01 LAB — CMP14+EGFR
ALBUMIN: 4.6 g/dL (ref 3.5–5.5)
ALK PHOS: 65 IU/L (ref 39–117)
ALT: 25 IU/L (ref 0–32)
AST: 24 IU/L (ref 0–40)
Albumin/Globulin Ratio: 2 (ref 1.2–2.2)
BILIRUBIN TOTAL: 0.2 mg/dL (ref 0.0–1.2)
BUN/Creatinine Ratio: 30 — ABNORMAL HIGH (ref 9–23)
BUN: 23 mg/dL (ref 6–24)
CHLORIDE: 101 mmol/L (ref 96–106)
CO2: 23 mmol/L (ref 20–29)
Calcium: 9.5 mg/dL (ref 8.7–10.2)
Creatinine, Ser: 0.77 mg/dL (ref 0.57–1.00)
GFR calc Af Amer: 105 mL/min/{1.73_m2} (ref >59)
GFR calc non Af Amer: 91 mL/min/{1.73_m2} (ref >59)
GLOBULIN, TOTAL: 2.3 g/dL (ref 1.5–4.5)
GLUCOSE: 87 mg/dL (ref 65–99)
POTASSIUM: 4.2 mmol/L (ref 3.5–5.2)
SODIUM: 139 mmol/L (ref 134–144)
Total Protein: 6.9 g/dL (ref 6.0–8.5)

## 2017-11-01 LAB — TSH: TSH: 0.97 u[IU]/mL (ref 0.450–4.500)

## 2017-11-01 LAB — VITAMIN D 25 HYDROXY (VIT D DEFICIENCY, FRACTURES): Vit D, 25-Hydroxy: 51.4 ng/mL (ref 30.0–100.0)

## 2017-11-03 LAB — IGP, APTIMA HPV, RFX 16/18,45
HPV APTIMA: NEGATIVE
PAP Smear Comment: 0

## 2017-11-21 DIAGNOSIS — Z0131 Encounter for examination of blood pressure with abnormal findings: Secondary | ICD-10-CM | POA: Diagnosis not present

## 2017-11-21 DIAGNOSIS — R079 Chest pain, unspecified: Secondary | ICD-10-CM | POA: Diagnosis not present

## 2017-11-27 ENCOUNTER — Other Ambulatory Visit: Payer: Self-pay | Admitting: Family

## 2017-12-17 ENCOUNTER — Other Ambulatory Visit: Payer: Self-pay | Admitting: *Deleted

## 2017-12-17 MED ORDER — ATORVASTATIN CALCIUM 40 MG PO TABS: 40.0000 mg | ORAL_TABLET | Freq: Every day | ORAL | 0 refills | Status: DC

## 2017-12-22 ENCOUNTER — Other Ambulatory Visit: Payer: Self-pay | Admitting: Family

## 2017-12-22 DIAGNOSIS — E559 Vitamin D deficiency, unspecified: Secondary | ICD-10-CM

## 2017-12-30 ENCOUNTER — Other Ambulatory Visit: Payer: Self-pay | Admitting: Family

## 2017-12-30 DIAGNOSIS — E559 Vitamin D deficiency, unspecified: Secondary | ICD-10-CM

## 2017-12-31 NOTE — Telephone Encounter (Signed)
Last Vit D 10/31/17  51.4

## 2018-01-06 DIAGNOSIS — Z1231 Encounter for screening mammogram for malignant neoplasm of breast: Secondary | ICD-10-CM | POA: Diagnosis not present

## 2018-01-06 LAB — HM MAMMOGRAPHY

## 2018-01-13 ENCOUNTER — Other Ambulatory Visit: Payer: Self-pay | Admitting: Family

## 2018-02-03 DIAGNOSIS — L259 Unspecified contact dermatitis, unspecified cause: Secondary | ICD-10-CM | POA: Diagnosis not present

## 2018-02-03 DIAGNOSIS — L400 Psoriasis vulgaris: Secondary | ICD-10-CM | POA: Diagnosis not present

## 2018-03-21 ENCOUNTER — Other Ambulatory Visit: Payer: Self-pay | Admitting: Family

## 2018-03-21 NOTE — Telephone Encounter (Signed)
Last seen 06/28/17

## 2018-04-17 ENCOUNTER — Other Ambulatory Visit: Payer: Self-pay | Admitting: Family

## 2018-04-17 DIAGNOSIS — E559 Vitamin D deficiency, unspecified: Secondary | ICD-10-CM

## 2018-04-18 NOTE — Telephone Encounter (Signed)
Last Vit D 10/31/17  51.4

## 2018-06-21 ENCOUNTER — Other Ambulatory Visit: Payer: Self-pay | Admitting: Family

## 2018-06-23 NOTE — Telephone Encounter (Signed)
Last lipid 06/28/17 

## 2018-07-09 ENCOUNTER — Other Ambulatory Visit: Payer: Self-pay | Admitting: Family

## 2018-09-08 ENCOUNTER — Other Ambulatory Visit: Payer: Self-pay | Admitting: Family

## 2018-09-08 DIAGNOSIS — E559 Vitamin D deficiency, unspecified: Secondary | ICD-10-CM

## 2018-09-20 ENCOUNTER — Other Ambulatory Visit: Payer: Self-pay | Admitting: Family

## 2018-09-23 ENCOUNTER — Ambulatory Visit (INDEPENDENT_AMBULATORY_CARE_PROVIDER_SITE_OTHER): Payer: PRIVATE HEALTH INSURANCE | Admitting: Family

## 2018-09-23 ENCOUNTER — Other Ambulatory Visit: Payer: Self-pay

## 2018-09-23 ENCOUNTER — Encounter: Payer: Self-pay | Admitting: Family

## 2018-09-23 DIAGNOSIS — I10 Essential (primary) hypertension: Secondary | ICD-10-CM

## 2018-09-23 DIAGNOSIS — F411 Generalized anxiety disorder: Secondary | ICD-10-CM

## 2018-09-23 DIAGNOSIS — E785 Hyperlipidemia, unspecified: Secondary | ICD-10-CM

## 2018-09-23 DIAGNOSIS — F331 Major depressive disorder, recurrent, moderate: Secondary | ICD-10-CM

## 2018-09-23 DIAGNOSIS — E559 Vitamin D deficiency, unspecified: Secondary | ICD-10-CM

## 2018-09-23 DIAGNOSIS — F17200 Nicotine dependence, unspecified, uncomplicated: Secondary | ICD-10-CM | POA: Diagnosis not present

## 2018-09-23 MED ORDER — VITAMIN D (ERGOCALCIFEROL) 1.25 MG (50000 UNIT) PO CAPS: 50000.0000 [IU] | ORAL_CAPSULE | ORAL | 1 refills | Status: DC

## 2018-09-23 MED ORDER — ATORVASTATIN CALCIUM 40 MG PO TABS: 40.0000 mg | ORAL_TABLET | Freq: Every day | ORAL | 1 refills | Status: DC

## 2018-09-23 NOTE — Progress Notes (Signed)
Virtual Visit via telephone Note  I connected with Maria West on 09/23/18 at 10:36 Am by telephone and verified that I am speaking with the correct person using two identifiers. Maria West is currently located at home  and no one  is currently with her during visit. The provider, Jannifer Rodney, FNP is located in their office at time of visit.  I discussed the limitations, risks, security and privacy concerns of performing an evaluation and management service by telephone and the availability of in person appointments. I also discussed with the patient that there may be a patient responsible charge related to this service. The patient expressed understanding and agreed to proceed.   History and Present Illness:  Pt presents to the office today for chronic follow up.  Hypertension  This is a chronic problem. The current episode started more than 1 year ago. The problem has been resolved since onset. The problem is controlled. Associated symptoms include anxiety and malaise/fatigue. Pertinent negatives include no headaches, peripheral edema or shortness of breath. Risk factors for coronary artery disease include dyslipidemia and sedentary lifestyle. The current treatment provides moderate improvement. There is no history of kidney disease, CAD/MI or heart failure.  Hyperlipidemia  This is a chronic problem. The current episode started more than 1 year ago. The problem is controlled. Recent lipid tests were reviewed and are normal. Exacerbating diseases include obesity. Pertinent negatives include no shortness of breath. Current antihyperlipidemic treatment includes statins. The current treatment provides moderate improvement of lipids. Risk factors for coronary artery disease include dyslipidemia, hypertension and a sedentary lifestyle.  Depression         This is a chronic problem.  The current episode started more than 1 year ago.   The onset quality is gradual.   The problem occurs  intermittently.  The problem has been waxing and waning since onset.  Associated symptoms include insomnia, irritable and restlessness.  Associated symptoms include no helplessness, no hopelessness, no headaches and not sad.  Past treatments include SSRIs - Selective serotonin reuptake inhibitors.  Compliance with treatment is good.  Past medical history includes anxiety.   Anxiety  Presents for follow-up visit. Symptoms include depressed mood, excessive worry, insomnia, irritability, nervous/anxious behavior and restlessness. Patient reports no shortness of breath. Symptoms occur most days. The severity of symptoms is moderate. The quality of sleep is good.    Nicotine Dependence  Presents for follow-up visit. Symptoms include insomnia and irritability. Her urge triggers include company of smokers. The symptoms have been stable. She smokes 1 pack of cigarettes per day.      Review of Systems  Constitutional: Positive for irritability and malaise/fatigue.  Respiratory: Negative for shortness of breath.   Neurological: Negative for headaches.  Psychiatric/Behavioral: Positive for depression. The patient is nervous/anxious and has insomnia.   All other systems reviewed and are negative.    Observations/Objective: No SOB or distress noted  Assessment and Plan: Maria West comes in today with chief complaint of No chief complaint on file.   Diagnosis and orders addressed:  1. Moderate episode of recurrent major depressive disorder (HCC)  2. Current smoker Smoking cessation discussed  3. Essential hypertension  4. GAD (generalized anxiety disorder)  5. Hyperlipidemia, unspecified hyperlipidemia type - atorvastatin (LIPITOR) 40 MG tablet; Take 1 tablet (40 mg total) by mouth daily.  Dispense: 90 tablet; Refill: 1  6. Vitamin D deficiency - Vitamin D, Ergocalciferol, (DRISDOL) 1.25 MG (50000 UT) CAPS capsule; Take 1 capsule (50,000 Units total) by  mouth once a week.  Dispense:  12 capsule; Refill: 1   Labs reviewed Health Maintenance reviewed Diet and exercise encouraged  Follow up plan: 3 months     I discussed the assessment and treatment plan with the patient. The patient was provided an opportunity to ask questions and all were answered. The patient agreed with the plan and demonstrated an understanding of the instructions.   The patient was advised to call back or seek an in-person evaluation if the symptoms worsen or if the condition fails to improve as anticipated.  The above assessment and management plan was discussed with the patient. The patient verbalized understanding of and has agreed to the management plan. Patient is aware to call the clinic if symptoms persist or worsen. Patient is aware when to return to the clinic for a follow-up visit. Patient educated on when it is appropriate to go to the emergency department.   Time call ended: 10:52 AM   I provided 16 minutes of non-face-to-face time during this encounter.    Jannifer Rodneyhristy Amanuel Sinkfield, FNP

## 2018-10-11 ENCOUNTER — Other Ambulatory Visit: Payer: Self-pay | Admitting: Family

## 2018-10-22 ENCOUNTER — Other Ambulatory Visit: Payer: Self-pay | Admitting: Family

## 2018-10-22 NOTE — Telephone Encounter (Signed)
Seen 09/23/18- please advise

## 2019-01-08 ENCOUNTER — Other Ambulatory Visit: Payer: Self-pay | Admitting: Family

## 2019-01-23 ENCOUNTER — Ambulatory Visit: Payer: PRIVATE HEALTH INSURANCE | Admitting: Family

## 2019-02-11 ENCOUNTER — Other Ambulatory Visit: Payer: Self-pay

## 2019-02-12 ENCOUNTER — Encounter: Payer: Self-pay | Admitting: Family

## 2019-02-12 ENCOUNTER — Telehealth: Payer: Self-pay | Admitting: *Deleted

## 2019-02-12 ENCOUNTER — Ambulatory Visit (INDEPENDENT_AMBULATORY_CARE_PROVIDER_SITE_OTHER): Payer: PRIVATE HEALTH INSURANCE | Admitting: Family

## 2019-02-12 VITALS — BP 123/76 | HR 69 | Temp 97.7°F | Ht 63.0 in | Wt 156.8 lb

## 2019-02-12 DIAGNOSIS — F331 Major depressive disorder, recurrent, moderate: Secondary | ICD-10-CM

## 2019-02-12 DIAGNOSIS — E559 Vitamin D deficiency, unspecified: Secondary | ICD-10-CM

## 2019-02-12 DIAGNOSIS — I10 Essential (primary) hypertension: Secondary | ICD-10-CM | POA: Diagnosis not present

## 2019-02-12 DIAGNOSIS — E785 Hyperlipidemia, unspecified: Secondary | ICD-10-CM

## 2019-02-12 DIAGNOSIS — Z1211 Encounter for screening for malignant neoplasm of colon: Secondary | ICD-10-CM

## 2019-02-12 DIAGNOSIS — F17200 Nicotine dependence, unspecified, uncomplicated: Secondary | ICD-10-CM

## 2019-02-12 DIAGNOSIS — L301 Dyshidrosis [pompholyx]: Secondary | ICD-10-CM | POA: Insufficient documentation

## 2019-02-12 DIAGNOSIS — Z0001 Encounter for general adult medical examination with abnormal findings: Secondary | ICD-10-CM

## 2019-02-12 DIAGNOSIS — Z0000 Encounter for general adult medical examination without abnormal findings: Secondary | ICD-10-CM

## 2019-02-12 DIAGNOSIS — F411 Generalized anxiety disorder: Secondary | ICD-10-CM

## 2019-02-12 MED ORDER — CLOBETASOL PROPIONATE 0.05 % EX CREA: 1.0000 "application " | TOPICAL_CREAM | Freq: Two times a day (BID) | CUTANEOUS | 3 refills | Status: DC

## 2019-02-12 MED ORDER — BUSPIRONE HCL 7.5 MG PO TABS: 7.5000 mg | ORAL_TABLET | Freq: Two times a day (BID) | ORAL | 3 refills | Status: DC

## 2019-02-12 NOTE — Telephone Encounter (Addendum)
Prior Auth for Clobetasol Cream 0.05%-APPROVED  Approved for generic Clobetasol Propionate Cream 0.05%, quantity up to 60 gm per 30 days, under the pharmacy benefit. Generic substitution required when available.  Key: [id number] -   PA Case ID: [id number] has not yet replied to your PA request. You may close this dialog, return to your dashboard, and perform other tasks.  To check for an update later, open this request again from your dashboard.  If [facility] has not replied to your request within 24 hours please contact [place] at [phone].  Pharmacy notified

## 2019-02-12 NOTE — Patient Instructions (Signed)
Dyshidrotic Eczema Dyshidrotic eczema (pompholyx) is a type of eczema that causes very itchy (pruritic), fluid-filled blisters (vesicles) to form on the hands and feet. It can affect people of any age, but is more common before the age of 40. There is no cure, but treatment and certain lifestyle changes can help relieve symptoms. What are the causes? The cause of this condition is not known. What increases the risk? You are more likely to develop this condition if:  You wash your hands frequently.  You have a personal history or family history of eczema, allergies, asthma, or hay fever.  You are allergic to metals such as nickel or cobalt.  You work with cement.  You smoke. What are the signs or symptoms? Symptoms of this condition may affect the hands, feet, or both. Symptoms may come and go (recur), and may include:  Severe itching, which may happen before blisters appear.  Blisters. These may form suddenly. ? In the early stages, blisters may form near the fingertips. ? In severe cases, blisters may grow to large blister masses (bullae). ? Blisters resolve in 2-3 weeks without bursting. This is followed by a dry phase in which itching eases.  Pain and swelling.  Cracks or long, narrow openings (fissures) in the skin.  Severe dryness.  Ridges on the nails. How is this diagnosed? This condition may be diagnosed based on:  A physical exam.  Your symptoms.  Your medical history.  Skin scrapings to rule out a fungal infection.  Testing a swab of fluid for bacteria (culture).  Removing and checking a small piece of skin (biopsy) in order to test for infection or to rule out other conditions.  Skin patch tests. These tests involve taking patches that contain possible allergens and placing them on your back. Your health care provider will wait a few days and then check to see if an allergic reaction occurred. These tests may be done if your health care provider suspects  allergic reactions, or to rule out other types of eczema. You may be referred to a health care provider who specializes in the skin (dermatologist) to help diagnose and treat this condition. How is this treated? There is no cure for this condition, but treatment can help relieve symptoms. Depending on how many blisters you have and how severe they are, your health care provider may suggest:  Avoiding allergens, irritants, or triggers that worsen symptoms. This may involve lifestyle changes such as: ? Using different lotions or soaps. ? Avoiding hot weather or places that will cause you to sweat a lot. ? Managing stress with coping techniques such as relaxation and exercise, and asking for help when you need it. ? Diet changes as recommended by your health care provider.  Using a clean, damp towel (cool compress) to relieve symptoms.  Soaking in a bath that contains a type of salt that relieves irritation (aluminum acetate soaks).  Medicine taken by mouth to reduce itching (oral antihistamines).  Medicine applied to the skin to reduce swelling and irritation (topical corticosteroids).  Medicine that reduces the activity of the body's disease-fighting system (immunosuppressants) to treat inflammation. This may be given in severe cases.  Antibiotic medicines to treat bacterial infection.  Light therapy (phototherapy). This involves shining ultraviolet (UV) light on affected skin in order to reduce itchiness and inflammation. Follow these instructions at home: Bathing and skin care   Wash skin gently. After bathing or washing your hands, pat your skin dry. Avoid rubbing your skin.  Remove   all jewelry before bathing. If the skin under the jewelry stays wet, blisters may form or get worse.  Apply cool compresses as told by your health care provider: ? Soak a clean towel in cool water. ? Wring out excess water until towel is damp. ? Place the towel over affected skin. Leave the towel on  for 20 minutes at a time, 2-3 times a day.  Use mild soaps, cleansers, and lotions that do not contain dyes, perfumes, or other irritants.  Keep your skin hydrated. To do this: ? Avoid very hot water. Take lukewarm baths or showers. ? Apply moisturizer within three minutes of bathing. This locks in moisture. Medicines  Take and apply over-the-counter and prescription medicines only as told by your health care provider.  If you were prescribed antibiotic medicine, take or apply it as told by your health care provider. Do not stop using the antibiotic even if you start to feel better. General instructions  Identify and avoid triggers and allergens.  Keep fingernails short to avoid breaking open the skin while scratching.  Use waterproof gloves to protect your hands when doing work that keeps your hands wet for a long time.  Wear socks to keep your feet dry.  Do not use any products that contain nicotine or tobacco, such as cigarettes and e-cigarettes. If you need help quitting, ask your health care provider.  Keep all follow-up visits as told by your health care provider. This is important. Contact a health care provider if:  You have symptoms that do not go away.  You have signs of infection, such as: ? Crusting, pus, or a bad smell. ? More redness, swelling, or pain. ? Increased warmth in the affected area. Summary  Dyshidrotic eczema (pompholyx) is a type of eczema that causes very itchy (pruritic), fluid-filled blisters (vesicles) to form on the hands and feet.  The cause of this condition is not known.  There is no cure for this condition, but treatment can help relieve symptoms. Treatment depends on how many blisters you have and how severe they are.  Use mild soaps, cleansers, and lotions that do not contain dyes, perfumes, or other irritants. Keep your skin hydrated. This information is not intended to replace advice given to you by your health care provider. Make sure  you discuss any questions you have with your health care provider. Document Released: 08/23/2016 Document Revised: 07/30/2018 Document Reviewed: 08/23/2016 Elsevier Patient Education  2020 [employer]  

## 2019-02-12 NOTE — Progress Notes (Signed)
Subjective:    Patient ID: Maria West, female    DOB: 11/21/1965, 53 y.o.   MRN: [id number]  Chief Complaint  Patient presents with  . Medical Management of Chronic Issues   PT presents to the office today for CPE without pap.  Hyperlipidemia This is a chronic problem. The current episode started more than 1 year ago. The problem is controlled. Recent lipid tests were reviewed and are normal. Pertinent negatives include no shortness of breath. Current antihyperlipidemic treatment includes statins. The current treatment provides moderate improvement of lipids. Risk factors for coronary artery disease include dyslipidemia, hypertension and a sedentary lifestyle.  Depression        This is a chronic problem.  The current episode started more than 1 year ago.   The onset quality is gradual.   The problem occurs intermittently.  The problem has been waxing and waning since onset.  Associated symptoms include decreased concentration, irritable, restlessness and decreased interest.  Associated symptoms include no helplessness, no hopelessness, not sad and no suicidal ideas.     The symptoms are aggravated by family issues.  Past treatments include SSRIs - Selective serotonin reuptake inhibitors.  Past medical history includes anxiety.   Anxiety Presents for follow-up visit. Symptoms include decreased concentration, excessive worry, irritability and restlessness. Patient reports no shortness of breath or suicidal ideas. The severity of symptoms is moderate.    Hypertension This is a chronic problem. The current episode started more than 1 year ago. The problem has been waxing and waning since onset. The problem is controlled. Associated symptoms include anxiety and malaise/fatigue. Pertinent negatives include no peripheral edema or shortness of breath. Risk factors for coronary artery disease include obesity and sedentary lifestyle. The current treatment provides moderate improvement. There is no  history of kidney disease, CAD/MI or heart failure.  Rash This is a chronic problem. The current episode started more than 1 year ago. The problem is unchanged. The affected locations include the right foot and left foot. The rash is characterized by dryness and itchiness. Pertinent negatives include no shortness of breath. Past treatments include moisturizer, cold compress and anti-itch cream. The treatment provided mild relief. Her past medical history is significant for eczema.      Review of Systems  Constitutional: Positive for irritability and malaise/fatigue.  Respiratory: Negative for shortness of breath.   Skin: Positive for rash.  Psychiatric/Behavioral: Positive for decreased concentration and depression. Negative for suicidal ideas.  All other systems reviewed and are negative.  Family History  Problem Relation Age of Onset  . Diabetes Mother     Social History   Socioeconomic History  . Marital status: Single    Spouse name: Not on file  . Number of children: Not on file  . Years of education: Not on file  . Highest education level: Not on file  Occupational History  . Not on file  Social Needs  . Financial resource strain: Not on file  . Food insecurity    Worry: Not on file    Inability: Not on file  . Transportation needs    Medical: Not on file    Non-medical: Not on file  Tobacco Use  . Smoking status: Current Every Day Smoker    Packs/day: 1.00    Types: Cigarettes    Start date: 04/23/1984  . Smokeless tobacco: Never Used  Substance and Sexual Activity  . Alcohol use: No  . Drug use: No  . Sexual activity: Not on file  Lifestyle  . Physical activity    Days per week: Not on file    Minutes per session: Not on file  . Stress: Not on file  Relationships  . Social Herbalist on phone: Not on file    Gets together: Not on file    Attends religious service: Not on file    Active member of club or organization: Not on file    Attends  meetings of clubs or organizations: Not on file    Relationship status: Not on file  Other Topics Concern  . Not on file  Social History Narrative  . Not on file       Objective:   Physical Exam Vitals signs reviewed.  Constitutional:      General: She is irritable. She is not in acute distress.    Appearance: She is well-developed.  HENT:     Head: Normocephalic and atraumatic.     Right Ear: Tympanic membrane normal.     Left Ear: Tympanic membrane normal.  Eyes:     Pupils: Pupils are equal, round, and reactive to light.  Neck:     Musculoskeletal: Normal range of motion and neck supple.     Thyroid: No thyromegaly.  Cardiovascular:     Rate and Rhythm: Normal rate and regular rhythm.     Heart sounds: Normal heart sounds. No murmur.  Pulmonary:     Effort: Pulmonary effort is normal. No respiratory distress.     Breath sounds: Normal breath sounds. No wheezing.  Abdominal:     General: Bowel sounds are normal. There is no distension.     Palpations: Abdomen is soft.     Tenderness: There is no abdominal tenderness.  Musculoskeletal: Normal range of motion.        General: No tenderness.  Skin:    General: Skin is warm and dry.     Findings: Rash present.     Comments: Erythemas, peeling on bilateral feet  Neurological:     Mental Status: She is alert and oriented to person, place, and time.     Cranial Nerves: No cranial nerve deficit.     Deep Tendon Reflexes: Reflexes are normal and symmetric.  Psychiatric:        Behavior: Behavior normal.        Thought Content: Thought content normal.        Judgment: Judgment normal.        BP 123/76   Pulse 69   Temp 97.7 F (36.5 C) (Temporal)   Ht _0  (1.6 m)   Wt 156 lb 12.8 oz (71.1 kg)   LMP 08/18/2009   SpO2 99%   BMI 27.78 kg/m   Assessment & Plan:  Blaise Palladino comes in today with chief complaint of Medical Management of Chronic Issues   Diagnosis and orders addressed:  1. Essential  hypertension - CMP14+EGFR - CBC with Differential/Platelet  2. Vitamin D deficiency - CMP14+EGFR - CBC with Differential/Platelet - VITAMIN D 25 Hydroxy (Vit-D Deficiency, Fractures)  3. Hyperlipidemia, unspecified hyperlipidemia type - CMP14+EGFR - CBC with Differential/Platelet - Lipid panel  4. GAD (generalized anxiety disorder) Will add Buspar BID prn Stress management discussed  - CMP14+EGFR - CBC with Differential/Platelet - busPIRone (BUSPAR) 7.5 MG tablet; Take 1 tablet (7.5 mg total) by mouth 2 (two) times daily.  Dispense: 60 tablet; Refill: 3  5. Moderate episode of recurrent major depressive disorder (HCC) - CMP14+EGFR - CBC with Differential/Platelet  6. Current  smoker Smoking cessation discussed - CMP14+EGFR - CBC with Differential/Platelet  7. Annual physical exam - CMP14+EGFR - CBC with Differential/Platelet - Lipid panel - VITAMIN D 25 Hydroxy (Vit-D Deficiency, Fractures) - TSH  8. Colon cancer screening - CMP14+EGFR - CBC with Differential/Platelet - Ambulatory referral to Gastroenterology  9. Dyshidrotic eczema Do not scratch Keep moisturized   - clobetasol cream (TEMOVATE) 0.05 %; Apply 1 application topically 2 (two) times daily.  Dispense: 60 g; Refill: 3   Labs pending Health Maintenance reviewed Diet and exercise encouraged  Follow up plan: 6 months    Evelina Dun, FNP

## 2019-02-13 LAB — CBC WITH DIFFERENTIAL/PLATELET
Basophils Absolute: 0 10*3/uL (ref 0.0–0.2)
Basos: 1 %
EOS (ABSOLUTE): 0 10*3/uL (ref 0.0–0.4)
Eos: 1 %
Hematocrit: 46 % (ref 34.0–46.6)
Hemoglobin: 15.7 g/dL (ref 11.1–15.9)
Immature Grans (Abs): 0 10*3/uL (ref 0.0–0.1)
Immature Granulocytes: 1 %
Lymphocytes Absolute: 1.5 10*3/uL (ref 0.7–3.1)
Lymphs: 22 %
MCH: 32 pg (ref 26.6–33.0)
MCHC: 34.1 g/dL (ref 31.5–35.7)
MCV: 94 fL (ref 79–97)
Monocytes Absolute: 0.6 10*3/uL (ref 0.1–0.9)
Monocytes: 8 %
Neutrophils Absolute: 4.4 10*3/uL (ref 1.4–7.0)
Neutrophils: 67 %
Platelets: 204 10*3/uL (ref 150–450)
RBC: 4.9 x10E6/uL (ref 3.77–5.28)
RDW: 12.7 % (ref 11.7–15.4)
WBC: 6.6 10*3/uL (ref 3.4–10.8)

## 2019-02-13 LAB — CMP14+EGFR
ALT: 42 IU/L — ABNORMAL HIGH (ref 0–32)
AST: 38 IU/L (ref 0–40)
Albumin/Globulin Ratio: 2.1 (ref 1.2–2.2)
Albumin: 4.9 g/dL (ref 3.8–4.9)
Alkaline Phosphatase: 86 IU/L (ref 39–117)
BUN/Creatinine Ratio: 17 (ref 9–23)
BUN: 11 mg/dL (ref 6–24)
Bilirubin Total: 0.5 mg/dL (ref 0.0–1.2)
CO2: 21 mmol/L (ref 20–29)
Calcium: 10 mg/dL (ref 8.7–10.2)
Chloride: 101 mmol/L (ref 96–106)
Creatinine, Ser: 0.66 mg/dL (ref 0.57–1.00)
GFR calc Af Amer: 118 mL/min/{1.73_m2} (ref >59)
GFR calc non Af Amer: 103 mL/min/{1.73_m2} (ref >59)
Globulin, Total: 2.3 g/dL (ref 1.5–4.5)
Glucose: 91 mg/dL (ref 65–99)
Potassium: 4.1 mmol/L (ref 3.5–5.2)
Sodium: 140 mmol/L (ref 134–144)
Total Protein: 7.2 g/dL (ref 6.0–8.5)

## 2019-02-13 LAB — LIPID PANEL
Chol/HDL Ratio: 3.3 ratio (ref 0.0–4.4)
Cholesterol, Total: 220 mg/dL — ABNORMAL HIGH (ref 100–199)
HDL: 67 mg/dL (ref >39)
LDL Chol Calc (NIH): 138 mg/dL — ABNORMAL HIGH (ref 0–99)
Triglycerides: 86 mg/dL (ref 0–149)
VLDL Cholesterol Cal: 15 mg/dL (ref 5–40)

## 2019-02-13 LAB — VITAMIN D 25 HYDROXY (VIT D DEFICIENCY, FRACTURES): Vit D, 25-Hydroxy: 49.5 ng/mL (ref 30.0–100.0)

## 2019-02-13 LAB — TSH: TSH: 0.545 u[IU]/mL (ref 0.450–4.500)

## 2019-02-17 ENCOUNTER — Encounter: Payer: Self-pay | Admitting: Gastroenterology

## 2019-03-16 ENCOUNTER — Other Ambulatory Visit: Payer: Self-pay

## 2019-03-16 ENCOUNTER — Ambulatory Visit (INDEPENDENT_AMBULATORY_CARE_PROVIDER_SITE_OTHER): Payer: PRIVATE HEALTH INSURANCE | Admitting: Gastroenterology

## 2019-03-16 ENCOUNTER — Encounter: Payer: Self-pay | Admitting: Gastroenterology

## 2019-03-16 DIAGNOSIS — Z1211 Encounter for screening for malignant neoplasm of colon: Secondary | ICD-10-CM | POA: Diagnosis not present

## 2019-03-16 DIAGNOSIS — Z79899 Other long term (current) drug therapy: Secondary | ICD-10-CM | POA: Insufficient documentation

## 2019-03-16 NOTE — Progress Notes (Addendum)
Primary Care Physician:  Junie Spencer, FNP  Primary Gastroenterologist:  Jonette Eva, MD  REVIEWED-NO ADDITIONAL RECOMMENDATIONS.   Chief Complaint  Patient presents with  . Consult    tcs Never done prior    HPI:  Maria West is a 53 y.o. female here at the request of Jannifer Rodney, FNP to schedule colonoscopy.  She has a history of anxiety, depression, hypertension, hyperlipidemia.  From a GI standpoint she feels good.  No constipation or diarrhea on a routine basis.  Occasionally has loose stools with certain food she eats.  This is been her baseline for years.  No melena or rectal bleeding.  Denies abdominal pain.  No unintentional weight loss.  No upper GI symptoms.  She is never had a colonoscopy.  No family history of colon cancer.  Current Outpatient Medications  Medication Sig Dispense Refill  . aspirin EC 81 MG tablet Take 1 tablet (81 mg total) by mouth daily. 30 tablet 1  . atorvastatin (LIPITOR) 40 MG tablet Take 1 tablet (40 mg total) by mouth daily. 90 tablet 1  . busPIRone (BUSPAR) 7.5 MG tablet Take 1 tablet (7.5 mg total) by mouth 2 (two) times daily. 60 tablet 3  . citalopram (CELEXA) 40 MG tablet Take 1 tablet (40 mg total) by mouth daily. (Patient taking differently: Take 20 mg by mouth daily. ) 90 tablet 5  . clobetasol cream (TEMOVATE) 0.05 % Apply 1 application topically 2 (two) times daily. 60 g 3  . hydrochlorothiazide (MICROZIDE) 12.5 MG capsule TAKE 1 CAPSULE BY MOUTH ONCE DAILY .PLEASE  MAKE  6  MONTH  APPOINTMENT 90 capsule 0  . losartan (COZAAR) 50 MG tablet TAKE 1 TABLET BY MOUTH ONCE DAILY .PLEASE  MAKE  3  MONTH  APPOINTMENT 90 tablet 0  . Vitamin D, Ergocalciferol, (DRISDOL) 1.25 MG (50000 UT) CAPS capsule Take 1 capsule (50,000 Units total) by mouth once a week. 12 capsule 1   No current facility-administered medications for this visit.     Allergies as of 03/16/2019 - Review Complete 03/16/2019  Allergen Reaction Noted  . Penicillins  Itching 02/07/2015    Past Medical History:  Diagnosis Date  . Depression   . Generalized anxiety disorder   . Hyperlipidemia   . Hypertension     Past Surgical History:  Procedure Laterality Date  . LEG SURGERY Bilateral 1990s   cysts in figula heads    Family History  Problem Relation Age of Onset  . Diabetes Mother   . Colon cancer Neg Hx     Social History   Socioeconomic History  . Marital status: Single    Spouse name: Not on file  . Number of children: Not on file  . Years of education: Not on file  . Highest education level: Not on file  Occupational History  . Not on file  Social Needs  . Financial resource strain: Not on file  . Food insecurity    Worry: Not on file    Inability: Not on file  . Transportation needs    Medical: Not on file    Non-medical: Not on file  Tobacco Use  . Smoking status: Current Every Day Smoker    Packs/day: 1.00    Types: Cigarettes    Start date: 04/23/1984  . Smokeless tobacco: Never Used  Substance and Sexual Activity  . Alcohol use: Yes    Comment: 2-3 shots whisky per night  . Drug use: No  . Sexual activity: Not on  file  Lifestyle  . Physical activity    Days per week: Not on file    Minutes per session: Not on file  . Stress: Not on file  Relationships  . Social Herbalist on phone: Not on file    Gets together: Not on file    Attends religious service: Not on file    Active member of club or organization: Not on file    Attends meetings of clubs or organizations: Not on file    Relationship status: Not on file  . Intimate partner violence    Fear of current or ex partner: Not on file    Emotionally abused: Not on file    Physically abused: Not on file    Forced sexual activity: Not on file  Other Topics Concern  . Not on file  Social History Narrative  . Not on file      ROS:  General: Negative for anorexia, weight loss, fever, chills, fatigue, weakness. Eyes: Negative for vision  changes.  ENT: Negative for hoarseness, difficulty swallowing , nasal congestion. CV: Negative for chest pain, angina, palpitations, dyspnea on exertion, peripheral edema.  Respiratory: Negative for dyspnea at rest, dyspnea on exertion, cough, sputum, wheezing.  GI: See history of present illness. GU:  Negative for dysuria, hematuria, urinary incontinence, urinary frequency, nocturnal urination.  MS: Negative for joint pain, low back pain.  Derm: Negative for rash or itching.  Neuro: Negative for weakness, abnormal sensation, seizure, frequent headaches, memory loss, confusion.  Psych: Negative for anxiety, depression, suicidal ideation, hallucinations.  Endo: Negative for unusual weight change.  Heme: Negative for bruising or bleeding. Allergy: Negative for rash or hives.    Physical Examination:  BP 137/79   Pulse 69   Temp (!) 97 F (36.1 C) (Oral)   Ht 5\' 3"  (1.6 m)   Wt 157 lb 6.4 oz (71.4 kg)   LMP 08/18/2009   BMI 27.88 kg/m    General: Well-nourished, well-developed in no acute distress.  Head: Normocephalic, atraumatic.   Eyes: Conjunctiva pink, no icterus. Mouth: masked Neck: Supple without thyromegaly, masses, or lymphadenopathy.  Lungs: Clear to auscultation bilaterally.  Heart: Regular rate and rhythm, no murmurs rubs or gallops.  Abdomen: Bowel sounds are normal, nontender, nondistended, no hepatosplenomegaly or masses, no abdominal bruits or    hernia , no rebound or guarding.   Rectal: Deferred Extremities: No lower extremity edema. No clubbing or deformities.  Neuro: Alert and oriented x 4 , grossly normal neurologically.  Skin: Warm and dry, no rash or jaundice.   Psych: Alert and cooperative, normal mood and affect.  Labs: Lab Results  Component Value Date   TSH 0.545 02/12/2019   Lab Results  Component Value Date   CREATININE 0.66 02/12/2019   BUN 11 02/12/2019   NA 140 02/12/2019   K 4.1 02/12/2019   CL 101 02/12/2019   CO2 21 02/12/2019    Lab Results  Component Value Date   ALT 42 (H) 02/12/2019   AST 38 02/12/2019   ALKPHOS 86 02/12/2019   BILITOT 0.5 02/12/2019   Lab Results  Component Value Date   WBC 6.6 02/12/2019   HGB 15.7 02/12/2019   HCT 46.0 02/12/2019   MCV 94 02/12/2019   PLT 204 02/12/2019     Imaging Studies: No results found.

## 2019-03-16 NOTE — Assessment & Plan Note (Signed)
First-ever screening colonoscopy in the near future.  Due to polypharmacy plan for deep sedation.  I have discussed the risks, alternatives, benefits with regards to but not limited to the risk of reaction to medication, bleeding, infection, perforation and the patient is agreeable to proceed. Written consent to be obtained.

## 2019-03-16 NOTE — Patient Instructions (Signed)
1. Colonoscopy as scheduled. Please see separate instructions. 

## 2019-03-16 NOTE — Progress Notes (Signed)
cc'ed to pcp °

## 2019-03-23 ENCOUNTER — Other Ambulatory Visit: Payer: Self-pay | Admitting: Family

## 2019-03-23 ENCOUNTER — Telehealth: Payer: Self-pay | Admitting: *Deleted

## 2019-03-23 DIAGNOSIS — E785 Hyperlipidemia, unspecified: Secondary | ICD-10-CM

## 2019-03-23 NOTE — Telephone Encounter (Signed)
Can you call her back when you get a chance? [phone]

## 2019-03-23 NOTE — Telephone Encounter (Signed)
LMOVM

## 2019-03-24 ENCOUNTER — Other Ambulatory Visit: Payer: Self-pay | Admitting: *Deleted

## 2019-03-24 ENCOUNTER — Encounter: Payer: Self-pay | Admitting: *Deleted

## 2019-03-24 DIAGNOSIS — Z1211 Encounter for screening for malignant neoplasm of colon: Secondary | ICD-10-CM

## 2019-03-24 MED ORDER — NA SULFATE-K SULFATE-MG SULF 17.5-3.13-1.6 GM/177ML PO SOLN: 1.0000 | Freq: Once | ORAL | 0 refills | Status: AC

## 2019-03-24 NOTE — Telephone Encounter (Signed)
Called patient. She is scheduled for TCS with MAC 3/9 at 10:45am. Patient aware will mail prep instructions with her pre-op/covid appt. Confirmed address. Rx sent to pharmacy. Orders entered. Patient states she knows her insurance will change as of 04/24/2019 but does not know what it will be. I advised once she received copy to bring by to [place] to put in her chart. She voiced understanding.

## 2019-04-03 ENCOUNTER — Other Ambulatory Visit: Payer: Self-pay | Admitting: Family

## 2019-05-09 ENCOUNTER — Other Ambulatory Visit: Payer: Self-pay | Admitting: Family

## 2019-05-09 DIAGNOSIS — E559 Vitamin D deficiency, unspecified: Secondary | ICD-10-CM

## 2019-05-13 ENCOUNTER — Telehealth: Payer: Self-pay | Admitting: Gastroenterology

## 2019-05-13 NOTE — Telephone Encounter (Signed)
Noted. No PA needed for upcoming procedure from [facility].

## 2019-05-13 NOTE — Telephone Encounter (Signed)
PATIENT CALLED AND STATED HER INSURANCE HAS CHANGED TO [employer] AND SHE WILL FAX OVER A COPY OF HER NEW CARD/  SHE HAS AN UPCOMING PROCEDURE IN [facility]

## 2019-05-24 ENCOUNTER — Other Ambulatory Visit: Payer: Self-pay | Admitting: Family

## 2019-05-24 DIAGNOSIS — E559 Vitamin D deficiency, unspecified: Secondary | ICD-10-CM

## 2019-06-06 ENCOUNTER — Other Ambulatory Visit: Payer: Self-pay | Admitting: Family

## 2019-06-06 DIAGNOSIS — E559 Vitamin D deficiency, unspecified: Secondary | ICD-10-CM

## 2019-06-25 NOTE — Patient Instructions (Signed)
Maria West  06/25/2019     @PREFPERIOPPHARMACY @   Your procedure is scheduled on  06/30/2019.  Report to 08/30/2019 at  0845  A.M.  Call this number if you have problems the morning of surgery:  [phone]   Remember:  Follow the diet and prep instructions given to you by Dr [phone] office.                    Take these medicines the morning of surgery with A SIP OF WATER  Buspar, celexa, losartan.    Do not wear jewelry, make-up or nail polish.  Do not wear lotions, powders, or perfumes. Please wear deodorant and brush your teeth  Do not shave 48 hours prior to surgery.  Men may shave face and neck.  Do not bring valuables to the hospital.  [facility] is not responsible for any belongings or valuables.  Contacts, dentures or bridgework may not be worn into surgery.  Leave your suitcase in the car.  After surgery it may be brought to your room.  For patients admitted to the hospital, discharge time will be determined by your treatment team.  Patients discharged the day of surgery will not be allowed to drive home.   Name and phone number of your driver:   family Special instructions:  DO NOT smoke the day of your procedure.  Please read over the following fact sheets that you were given. Anesthesia Post-op Instructions and Care and Recovery After Surgery       Colonoscopy, Adult, Care After This sheet gives you information about how to care for yourself after your procedure. Your health care provider may also give you more specific instructions. If you have problems or questions, contact your health care provider. What can I expect after the procedure? After the procedure, it is common to have:  A small amount of blood in your stool for 24 hours after the procedure.  Some gas.  Mild cramping or bloating of your abdomen. Follow these instructions at home: Eating and drinking   Drink enough fluid to keep your urine pale yellow.  Follow instructions  from your health care provider about eating or drinking restrictions.  Resume your normal diet as instructed by your health care provider. Avoid heavy or fried foods that are hard to digest. Activity  Rest as told by your health care provider.  Avoid sitting for a long time without moving. Get up to take short walks every 1-2 hours. This is important to improve blood flow and breathing. Ask for help if you feel weak or unsteady.  Return to your normal activities as told by your health care provider. Ask your health care provider what activities are safe for you. Managing cramping and bloating   Try walking around when you have cramps or feel bloated.  Apply heat to your abdomen as told by your health care provider. Use the heat source that your health care provider recommends, such as a moist heat pack or a heating pad. ? Place a towel between your skin and the heat source. ? Leave the heat on for 20-30 minutes. ? Remove the heat if your skin turns bright red. This is especially important if you are unable to feel pain, heat, or cold. You may have a greater risk of getting burned. General instructions  For the first 24 hours after the procedure: ? Do not drive or use machinery. ? Do not sign important documents. ?  Do not drink alcohol. ? Do your regular daily activities at a slower pace than normal. ? Eat soft foods that are easy to digest.  Take over-the-counter and prescription medicines only as told by your health care provider.  Keep all follow-up visits as told by your health care provider. This is important. Contact a health care provider if:  You have blood in your stool 2-3 days after the procedure. Get help right away if you have:  More than a small spotting of blood in your stool.  Large blood clots in your stool.  Swelling of your abdomen.  Nausea or vomiting.  A fever.  Increasing pain in your abdomen that is not relieved with medicine. Summary  After the  procedure, it is common to have a small amount of blood in your stool. You may also have mild cramping and bloating of your abdomen.  For the first 24 hours after the procedure, do not drive or use machinery, sign important documents, or drink alcohol.  Get help right away if you have a lot of blood in your stool, nausea or vomiting, a fever, or increased pain in your abdomen. This information is not intended to replace advice given to you by your health care provider. Make sure you discuss any questions you have with your health care provider. Document Revised: 11/03/2018 Document Reviewed: 11/03/2018 Elsevier Patient Education  [place] After These instructions provide you with information about caring for yourself after your procedure. Your health care provider may also give you more specific instructions. Your treatment has been planned according to current medical practices, but problems sometimes occur. Call your health care provider if you have any problems or questions after your procedure. What can I expect after the procedure? After your procedure, you may:  Feel sleepy for several hours.  Feel clumsy and have poor balance for several hours.  Feel forgetful about what happened after the procedure.  Have poor judgment for several hours.  Feel nauseous or vomit.  Have a sore throat if you had a breathing tube during the procedure. Follow these instructions at home: For at least 24 hours after the procedure:      Have a responsible adult stay with you. It is important to have someone help care for you until you are awake and alert.  Rest as needed.  Do not: ? Participate in activities in which you could fall or become injured. ? Drive. ? Use heavy machinery. ? Drink alcohol. ? Take sleeping pills or medicines that cause drowsiness. ? Make important decisions or sign legal documents. ? Take care of children on your own. Eating  and drinking  Follow the diet that is recommended by your health care provider.  If you vomit, drink water, juice, or soup when you can drink without vomiting.  Make sure you have little or no nausea before eating solid foods. General instructions  Take over-the-counter and prescription medicines only as told by your health care provider.  If you have sleep apnea, surgery and certain medicines can increase your risk for breathing problems. Follow instructions from your health care provider about wearing your sleep device: ? Anytime you are sleeping, including during daytime naps. ? While taking prescription pain medicines, sleeping medicines, or medicines that make you drowsy.  If you smoke, do not smoke without supervision.  Keep all follow-up visits as told by your health care provider. This is important. Contact a health care provider if:  You keep  feeling nauseous or you keep vomiting.  You feel light-headed.  You develop a rash.  You have a fever. Get help right away if:  You have trouble breathing. Summary  For several hours after your procedure, you may feel sleepy and have poor judgment.  Have a responsible adult stay with you for at least 24 hours or until you are awake and alert. This information is not intended to replace advice given to you by your health care provider. Make sure you discuss any questions you have with your health care provider. Document Revised: 07/08/2017 Document Reviewed: 07/31/2015 Elsevier Patient Education  [place].

## 2019-06-29 ENCOUNTER — Other Ambulatory Visit: Payer: Self-pay

## 2019-06-29 ENCOUNTER — Other Ambulatory Visit (HOSPITAL_COMMUNITY)
Admission: RE | Admit: 2019-06-29 | Discharge: 2019-06-29 | Disposition: A | Discharge status: Home / Self Care | Payer: BC Managed Care – PPO | Source: Ambulatory Visit | Attending: Gastroenterology | Admitting: Gastroenterology

## 2019-06-29 ENCOUNTER — Encounter (HOSPITAL_COMMUNITY)
Admission: RE | Admit: 2019-06-29 | Discharge: 2019-06-29 | Disposition: A | Discharge status: Home / Self Care | Payer: BC Managed Care – PPO | Source: Ambulatory Visit | Attending: Gastroenterology | Admitting: Gastroenterology

## 2019-06-29 ENCOUNTER — Encounter (HOSPITAL_COMMUNITY): Payer: Self-pay

## 2019-06-29 DIAGNOSIS — Z20822 Contact with and (suspected) exposure to covid-19: Secondary | ICD-10-CM | POA: Insufficient documentation

## 2019-06-29 DIAGNOSIS — Z01812 Encounter for preprocedural laboratory examination: Secondary | ICD-10-CM | POA: Diagnosis not present

## 2019-06-29 LAB — BASIC METABOLIC PANEL
Anion gap: 10 (ref 5–15)
BUN: 13 mg/dL (ref 6–20)
CO2: 25 mmol/L (ref 22–32)
Calcium: 9 mg/dL (ref 8.9–10.3)
Chloride: 103 mmol/L (ref 98–111)
Creatinine, Ser: 0.64 mg/dL (ref 0.44–1.00)
GFR calc Af Amer: >60 mL/min (ref >60)
GFR calc non Af Amer: >60 mL/min (ref >60)
Glucose, Bld: 138 mg/dL — ABNORMAL HIGH (ref 70–99)
Potassium: 3.6 mmol/L (ref 3.5–5.1)
Sodium: 138 mmol/L (ref 135–145)

## 2019-06-30 ENCOUNTER — Encounter (HOSPITAL_COMMUNITY)
Admission: RE | Disposition: A | Discharge status: Home / Self Care | Payer: Self-pay | Source: Home / Self Care | Attending: Gastroenterology

## 2019-06-30 ENCOUNTER — Ambulatory Visit (HOSPITAL_COMMUNITY)
Admission: RE | Admit: 2019-06-30 | Discharge: 2019-06-30 | Disposition: A | Discharge status: Home / Self Care | Payer: BC Managed Care – PPO | Attending: Gastroenterology | Admitting: Gastroenterology

## 2019-06-30 ENCOUNTER — Ambulatory Visit (HOSPITAL_COMMUNITY): Payer: BC Managed Care – PPO | Admitting: Anesthesiology

## 2019-06-30 DIAGNOSIS — F329 Major depressive disorder, single episode, unspecified: Secondary | ICD-10-CM | POA: Insufficient documentation

## 2019-06-30 DIAGNOSIS — F17210 Nicotine dependence, cigarettes, uncomplicated: Secondary | ICD-10-CM | POA: Diagnosis not present

## 2019-06-30 DIAGNOSIS — K648 Other hemorrhoids: Secondary | ICD-10-CM | POA: Insufficient documentation

## 2019-06-30 DIAGNOSIS — F411 Generalized anxiety disorder: Secondary | ICD-10-CM | POA: Diagnosis not present

## 2019-06-30 DIAGNOSIS — E785 Hyperlipidemia, unspecified: Secondary | ICD-10-CM | POA: Insufficient documentation

## 2019-06-30 DIAGNOSIS — Z79899 Other long term (current) drug therapy: Secondary | ICD-10-CM | POA: Diagnosis not present

## 2019-06-30 DIAGNOSIS — Q438 Other specified congenital malformations of intestine: Secondary | ICD-10-CM | POA: Insufficient documentation

## 2019-06-30 DIAGNOSIS — K644 Residual hemorrhoidal skin tags: Secondary | ICD-10-CM | POA: Diagnosis not present

## 2019-06-30 DIAGNOSIS — Z880 Allergy status to penicillin: Secondary | ICD-10-CM | POA: Diagnosis not present

## 2019-06-30 DIAGNOSIS — I10 Essential (primary) hypertension: Secondary | ICD-10-CM | POA: Diagnosis not present

## 2019-06-30 DIAGNOSIS — Z1211 Encounter for screening for malignant neoplasm of colon: Secondary | ICD-10-CM | POA: Diagnosis not present

## 2019-06-30 HISTORY — PX: COLONOSCOPY WITH PROPOFOL: SHX5780

## 2019-06-30 LAB — SARS CORONAVIRUS 2 (TAT 6-24 HRS): SARS Coronavirus 2: NEGATIVE

## 2019-06-30 SURGERY — COLONOSCOPY WITH PROPOFOL
Anesthesia: Monitor Anesthesia Care

## 2019-06-30 MED ORDER — LACTATED RINGERS IV SOLN
INTRAVENOUS | Status: DC
  Administered 2019-06-30: 1000 mL via INTRAVENOUS

## 2019-06-30 MED ORDER — PROPOFOL 500 MG/50ML IV EMUL
INTRAVENOUS | Status: DC | PRN
  Administered 2019-06-30: 150 ug/kg/min via INTRAVENOUS

## 2019-06-30 MED ORDER — GLYCOPYRROLATE PF 0.2 MG/ML IJ SOSY
PREFILLED_SYRINGE | INTRAMUSCULAR | Status: AC
  Filled 2019-06-30: qty 1

## 2019-06-30 MED ORDER — GLYCOPYRROLATE 0.2 MG/ML IJ SOLN
INTRAMUSCULAR | Status: DC | PRN
  Administered 2019-06-30: .2 mg via INTRAVENOUS

## 2019-06-30 MED ORDER — LIDOCAINE HCL (CARDIAC) PF 50 MG/5ML IV SOSY
PREFILLED_SYRINGE | INTRAVENOUS | Status: DC | PRN
  Administered 2019-06-30: 50 mg via INTRAVENOUS

## 2019-06-30 MED ORDER — EPHEDRINE SULFATE 50 MG/ML IJ SOLN
INTRAMUSCULAR | Status: DC | PRN
  Administered 2019-06-30: 5 mg via INTRAVENOUS

## 2019-06-30 MED ORDER — EPHEDRINE 5 MG/ML INJ
INTRAVENOUS | Status: AC
  Filled 2019-06-30: qty 10

## 2019-06-30 MED ORDER — PROPOFOL 10 MG/ML IV BOLUS
INTRAVENOUS | Status: DC | PRN
  Administered 2019-06-30 (×2): 20 mg via INTRAVENOUS
  Administered 2019-06-30: 40 mg via INTRAVENOUS

## 2019-06-30 NOTE — Anesthesia Procedure Notes (Signed)
Date/Time: 06/30/2019 11:01 AM Performed by: Franco Nones, CRNA

## 2019-06-30 NOTE — Op Note (Signed)
[facility] Patient Name: Maria West Procedure Date: 06/30/2019 10:44 AM MRN: [id number] Date of Birth: [birth year] Attending MD: Jonette Eva MD, MD CSN: [id number] Age: 54 Admit Type: Outpatient Procedure:                Colonoscopy, SCREENING Indications:              Screening for colorectal malignant neoplasm Providers:                Jonette Eva MD, MD, Loma Messing B. Patsy Lager, RN, Ina Homes, Technician Referring MD:             Edilia Bo Hawks Medicines:                Propofol per Anesthesia Complications:            No immediate complications. Estimated Blood Loss:     Estimated blood loss was minimal. Procedure:                Pre-Anesthesia Assessment:                           - Prior to the procedure, a History and Physical                            was performed, and patient medications and                            allergies were reviewed. The patient's tolerance of                            previous anesthesia was also reviewed. The risks                            and benefits of the procedure and the sedation                            options and risks were discussed with the patient.                            All questions were answered, and informed consent                            was obtained. Prior Anticoagulants: The patient has                            taken no previous anticoagulant or antiplatelet                            agents. ASA Grade Assessment: II - A patient with                            mild systemic disease. After reviewing the risks  and benefits, the patient was deemed in                            satisfactory condition to undergo the procedure.                            After obtaining informed consent, the colonoscope                            was passed under direct vision. Throughout the                            procedure, the patient's blood pressure, pulse, and                            oxygen saturations were monitored continuously. The                            [id number]-[id number] ([id number]) scope was introduced through                            the anus and advanced to the the cecum, identified                            by appendiceal orifice and ileocecal valve. The                            colonoscopy was somewhat difficult due to a                            tortuous colon. Successful completion of the                            procedure was aided by straightening and shortening                            the scope to obtain bowel loop reduction and                            COLOWRAP. The patient tolerated the procedure well.                            The quality of the bowel preparation was excellent.                            The ileocecal valve, appendiceal orifice, and                            rectum were photographed. Scope In: 11:12:11 AM Scope Out: 11:23:40 AM Scope Withdrawal Time: 0 hours 9 minutes 5 seconds  Total Procedure Duration: 0 hours 11 minutes 29 seconds  Findings:      External and internal hemorrhoids were found. The hemorrhoids were       moderate.  The exam was otherwise without abnormality.      The recto-sigmoid colon and sigmoid colon were mildly tortuous. Impression:               - External and internal hemorrhoids.                           - The examination was otherwise normal.                           - Tortuous colon. Moderate Sedation:      Per Anesthesia Care Recommendation:           - Patient has a contact number available for                            emergencies. The signs and symptoms of potential                            delayed complications were discussed with the                            patient. Return to normal activities tomorrow.                            Written discharge instructions were provided to the                            patient.                           - High  fiber diet.                           - Continue present medications.                           - Await pathology results.                           - Repeat colonoscopy in 10 years for surveillance. Procedure Code(s):        --- Professional ---                           [phone], Colonoscopy, flexible; diagnostic, including                            collection of specimen(s) by brushing or washing,                            when performed (separate procedure) Diagnosis Code(s):        --- Professional ---                           Z12.11, Encounter for screening for malignant                            neoplasm of colon  K64.8, Other hemorrhoids                           Q43.8, Other specified congenital malformations of                            intestine CPT copyright 2019 [employer]. All rights reserved. The codes documented in this report are preliminary and upon coder review may  be revised to meet current compliance requirements. Jonette Eva, MD Jonette Eva MD, MD 06/30/2019 11:38:54 AM This report has been signed electronically. Number of Addenda: 0

## 2019-06-30 NOTE — Transfer of Care (Signed)
Immediate Anesthesia Transfer of Care Note  Patient: Maria West  Procedure(s) Performed: COLONOSCOPY WITH PROPOFOL (N/A )  Patient Location: PACU  Anesthesia Type:MAC  Level of Consciousness: awake, alert  and patient cooperative  Airway & Oxygen Therapy: Patient Spontanous Breathing  Post-op Assessment: Report given to RN and Post -op Vital signs reviewed and stable  Post vital signs: Reviewed and stable  Last Vitals:  Vitals Value Taken Time  BP    Temp 97.8   Pulse 95 06/30/19 1129  Resp 18 06/30/19 1129  SpO2 98 % 06/30/19 1129  Vitals shown include unvalidated device data.  Last Pain:  Vitals:   06/30/19 0856  PainSc: 0-No pain         Complications: No apparent anesthesia complications

## 2019-06-30 NOTE — Progress Notes (Signed)
CC'D TO PCP °

## 2019-06-30 NOTE — Anesthesia Postprocedure Evaluation (Signed)
Anesthesia Post Note  Patient: Maria West  Procedure(s) Performed: COLONOSCOPY WITH PROPOFOL (N/A )  Patient location during evaluation: PACU Anesthesia Type: MAC Level of consciousness: awake and alert and patient cooperative Pain management: satisfactory to patient Vital Signs Assessment: post-procedure vital signs reviewed and stable Respiratory status: spontaneous breathing Cardiovascular status: stable Postop Assessment: no apparent nausea or vomiting and adequate PO intake Anesthetic complications: no     Last Vitals:  Vitals:   06/30/19 0856 06/30/19 1130  BP: 138/80 124/72  Pulse: 72 95  Resp: 16 18  Temp: 36.9 C 36.6 C  SpO2: 98% 98%    Last Pain:  Vitals:   06/30/19 1130  PainSc: 0-No pain                 Yeng Frankie

## 2019-06-30 NOTE — H&P (Signed)
Primary Care Physician:  Sharion Balloon, FNP Primary Gastroenterologist:  Dr. Oneida Alar  Pre-Procedure History & Physical: HPI:  Maria West is a 54 y.o. female here for COLON CANCER SCREENING.  Past Medical History:  Diagnosis Date  . Depression   . Generalized anxiety disorder   . Hyperlipidemia   . Hypertension     Past Surgical History:  Procedure Laterality Date  . LEG SURGERY Bilateral 1990s   cysts in figula heads    Prior to Admission medications   Medication Sig Start Date End Date Taking? Authorizing Provider  atorvastatin (LIPITOR) 40 MG tablet Take 1 tablet by mouth once daily Patient taking differently: Take 40 mg by mouth daily.  03/24/19  Yes Hawks, Christy A, FNP  busPIRone (BUSPAR) 7.5 MG tablet Take 1 tablet (7.5 mg total) by mouth 2 (two) times daily. Patient taking differently: Take 7.5 mg by mouth 2 (two) times daily as needed (anxiety.).  02/12/19  Yes Hawks, Christy A, FNP  citalopram (CELEXA) 40 MG tablet Take 1 tablet (40 mg total) by mouth daily. Patient taking differently: Take 20 mg by mouth daily.  09/20/17  Yes Hawks, Christy A, FNP  clobetasol cream (TEMOVATE) 4.26 % Apply 1 application topically 2 (two) times daily. Patient taking differently: Apply 1 application topically 2 (two) times daily as needed (ezcema on feet).  02/12/19  Yes Hawks, Christy A, FNP  hydrochlorothiazide (MICROZIDE) 12.5 MG capsule TAKE 1 CAPSULE BY MOUTH ONCE DAILY PLEASE  MAKE  A  6  MONTH  APPOINTMENT Patient taking differently: Take 12.5 mg by mouth daily.  04/03/19  Yes Hawks, Christy A, FNP  losartan (COZAAR) 50 MG tablet TAKE 1 TABLET BY MOUTH ONCE DAILY PLEASE  MAKE  3  MONTH  APPOINTMENT Patient taking differently: Take 50 mg by mouth daily.  04/03/19  Yes Hawks, Christy A, FNP  Vitamin D, Ergocalciferol, (DRISDOL) 1.25 MG (50000 UNIT) CAPS capsule Take 1 capsule by mouth once a week Patient taking differently: Take 50,000 Units by mouth every Sunday.  06/08/19  Yes  Hawks, Christy A, FNP  SUPREP BOWEL PREP KIT 17.5-3.13-1.6 GM/177ML SOLN Take 354 mLs by mouth once.  03/24/19   [provider]    Allergies as of 03/24/2019 - Review Complete 03/16/2019  Allergen Reaction Noted  . Penicillins Itching 02/07/2015    Family History  Problem Relation Age of Onset  . Diabetes Mother   . Colon cancer Neg Hx     Social History   Socioeconomic History  . Marital status: Single    Spouse name: Not on file  . Number of children: Not on file  . Years of education: Not on file  . Highest education level: Not on file  Occupational History  . Not on file  Tobacco Use  . Smoking status: Current Every Day Smoker    Packs/day: 1.00    Types: Cigarettes    Start date: 04/23/1984  . Smokeless tobacco: Never Used  Substance and Sexual Activity  . Alcohol use: Yes    Comment: 2-3 shots whisky per night  . Drug use: No  . Sexual activity: Not on file  Other Topics Concern  . Not on file  Social History Narrative  . Not on file   Social Determinants of Health   Financial Resource Strain:   . Difficulty of Paying Living Expenses: Not on file  Food Insecurity:   . Worried About Charity fundraiser in the Last Year: Not on file  . Ran  Out of Food in the Last Year: Not on file  Transportation Needs:   . Lack of Transportation (Medical): Not on file  . Lack of Transportation (Non-Medical): Not on file  Physical Activity:   . Days of Exercise per Week: Not on file  . Minutes of Exercise per Session: Not on file  Stress:   . Feeling of Stress : Not on file  Social Connections:   . Frequency of Communication with Friends and Family: Not on file  . Frequency of Social Gatherings with Friends and Family: Not on file  . Attends Religious Services: Not on file  . Active Member of Clubs or Organizations: Not on file  . Attends Archivist Meetings: Not on file  . Marital Status: Not on file  Intimate Partner Violence:   . Fear of Current  or Ex-Partner: Not on file  . Emotionally Abused: Not on file  . Physically Abused: Not on file  . Sexually Abused: Not on file    Review of Systems: See HPI, otherwise negative ROS   Physical Exam: BP 138/80 (BP Location: Right Arm)   Pulse 72   Temp 98.5 F (36.9 C)   Resp 16   LMP 08/18/2009   SpO2 98%  General:   Alert,  pleasant and cooperative in NAD Head:  Normocephalic and atraumatic. Neck:  Supple; Lungs:  Clear throughout to auscultation.    Heart:  Regular rate and rhythm. Abdomen:  Soft, nontender and nondistended. Normal bowel sounds, without guarding, and without rebound.   Neurologic:  Alert and  oriented x4;  grossly normal neurologically.  Impression/Plan:    SCREENING  Plan:  1. TCS TODAY DISCUSSED PROCEDURE, BENEFITS, & RISKS: < 1% chance of medication reaction, bleeding, perforation, ASPIRATION, or rupture of spleen/liver requiring surgery to fix it and missed polyps < 1 cm 10-20% of the time.

## 2019-06-30 NOTE — Anesthesia Preprocedure Evaluation (Addendum)
Anesthesia Evaluation  Patient identified by MRN, date of birth, ID band Patient unresponsive    Reviewed: Allergy & Precautions, H&P , NPO status , Patient's Chart, lab work & pertinent test results, reviewed documented beta blocker date and time   Airway Mallampati: II  TM Distance: >3 FB Neck ROM: Full    Dental  (+) Teeth Intact, Missing   Pulmonary Current Smoker,    Pulmonary exam normal        Cardiovascular hypertension, Normal cardiovascular exam     Neuro/Psych PSYCHIATRIC DISORDERS Anxiety Depression  Neuromuscular disease    GI/Hepatic negative GI ROS, Neg liver ROS,   Endo/Other  negative endocrine ROS  Renal/GU negative Renal ROS     Musculoskeletal negative musculoskeletal ROS (+)   Abdominal Normal abdominal exam  (+)   Peds  Hematology negative hematology ROS (+)   Anesthesia Other Findings   Reproductive/Obstetrics negative OB ROS                             Anesthesia Physical Anesthesia Plan  ASA: II  Anesthesia Plan: MAC   Post-op Pain Management:    Induction:   PONV Risk Score and Plan: 1 and Propofol infusion  Airway Management Planned: Nasal Cannula  Additional Equipment:   Intra-op Plan:   Post-operative Plan:   Informed Consent: I have reviewed the patients History and Physical, chart, labs and discussed the procedure including the risks, benefits and alternatives for the proposed anesthesia with the patient or authorized representative who has indicated his/her understanding and acceptance.       Plan Discussed with:   Anesthesia Plan Comments:         Anesthesia Quick Evaluation

## 2019-06-30 NOTE — Discharge Instructions (Signed)
You have moderate size internal and EXTERNAL hemorrhoids. YOU DID NOT HAVE ANY POLYPS.   DRINK WATER TO KEEP YOUR URINE LIGHT YELLOW.  FOLLOW A HIGH FIBER DIET. AVOID ITEMS THAT CAUSE BLOATING. SEE INFO BELOW.   USE PREPARATION H FOUR TIMES  A DAY IF NEEDED TO RELIEVE RECTAL PAIN/PRESSURE/BLEEDING.   Next colonoscopy in 10 years.  Colonoscopy Care After Read the instructions outlined below and refer to this sheet in the next week. These discharge instructions provide you with general information on caring for yourself after you leave the hospital. While your treatment has been planned according to the most current medical practices available, unavoidable complications occasionally occur. If you have any problems or questions after discharge, call DR. Jacolyn Joaquin, [phone].  ACTIVITY  You may resume your regular activity, but move at a slower pace for the next 24 hours.   Take frequent rest periods for the next 24 hours.   Walking will help get rid of the air and reduce the bloated feeling in your belly (abdomen).   No driving for 24 hours (because of the medicine (anesthesia) used during the test).   You may shower.   Do not sign any important legal documents or operate any machinery for 24 hours (because of the anesthesia used during the test).    NUTRITION  Drink plenty of fluids.   You may resume your normal diet as instructed by your doctor.   Begin with a light meal and progress to your normal diet. Heavy or fried foods are harder to digest and may make you feel sick to your stomach (nauseated).   Avoid alcoholic beverages for 24 hours or as instructed.    MEDICATIONS  You may resume your normal medications.   WHAT YOU CAN EXPECT TODAY  Some feelings of bloating in the abdomen.   Passage of more gas than usual.   Spotting of blood in your stool or on the toilet paper  .  IF YOU HAD POLYPS REMOVED DURING THE COLONOSCOPY:  Eat a soft diet IF YOU HAVE  NAUSEA, BLOATING, ABDOMINAL PAIN, OR VOMITING.    FINDING OUT THE RESULTS OF YOUR TEST Not all test results are available during your visit. DR. Oneida Alar WILL CALL YOU WITHIN 14 DAYS OF YOUR PROCEDUE WITH YOUR RESULTS. Do not assume everything is normal if you have not heard from DR. Bethzy Hauck, CALL HER OFFICE AT [phone].  SEEK IMMEDIATE MEDICAL ATTENTION AND CALL THE OFFICE: [phone] IF:  You have more than a spotting of blood in your stool.   Your belly is swollen (abdominal distention).   You are nauseated or vomiting.   You have a temperature over 101F.   You have abdominal pain or discomfort that is severe or gets worse throughout the day.  High-Fiber Diet A high-fiber diet changes your normal diet to include more whole grains, legumes, fruits, and vegetables. Changes in the diet involve replacing refined carbohydrates with unrefined foods. The calorie level of the diet is essentially unchanged. The Dietary Reference Intake (recommended amount) for adult males is 38 grams per day. For adult females, it is 25 grams per day. Pregnant and lactating women should consume 28 grams of fiber per day. Fiber is the intact part of a plant that is not broken down during digestion. Functional fiber is fiber that has been isolated from the plant to provide a beneficial effect in the body.  PURPOSE  Increase stool bulk.   Ease and regulate bowel movements.   Lower cholesterol.  REDUCE RISK OF COLON CANCER  INDICATIONS THAT YOU NEED MORE FIBER  Constipation and hemorrhoids.   Uncomplicated diverticulosis (intestine condition) and irritable bowel syndrome.   Weight management.   As a protective measure against hardening of the arteries (atherosclerosis), diabetes, and cancer.   GUIDELINES FOR INCREASING FIBER IN THE DIET  Start adding fiber to the diet slowly. A gradual increase of about 5 more grams (2 servings of most fruits or vegetables) per day is best. Too rapid an increase  in fiber may result in constipation, flatulence, and bloating.   Drink enough water and fluids to keep your urine clear or pale yellow. Water, juice, or caffeine-free drinks are recommended. Not drinking enough fluid may cause constipation.   Eat a variety of high-fiber foods rather than one type of fiber.   Try to increase your intake of fiber through using high-fiber foods rather than fiber pills or supplements that contain small amounts of fiber.   The goal is to change the types of food eaten. Do not supplement your present diet with high-fiber foods, but replace foods in your present diet.    Hemorrhoids Hemorrhoids are dilated (enlarged) veins around the rectum. Sometimes clots will form in the veins. This makes them swollen and painful. These are called thrombosed hemorrhoids. Causes of hemorrhoids include:  Constipation.   Straining to have a bowel movement.   HEAVY LIFTING  HOME CARE INSTRUCTIONS  Eat a well balanced diet and drink 6 to 8 glasses of water every day to avoid constipation. You may also use a bulk laxative.   Avoid straining to have bowel movements.   Keep anal area dry and clean.   Do not use a donut shaped pillow or sit on the toilet for long periods. This increases blood pooling and pain.   Move your bowels when your body has the urge; this will require less straining and will decrease pain and pressure.

## 2019-07-02 DIAGNOSIS — S39012A Strain of muscle, fascia and tendon of lower back, initial encounter: Secondary | ICD-10-CM | POA: Diagnosis not present

## 2019-07-02 DIAGNOSIS — M545 Low back pain: Secondary | ICD-10-CM | POA: Diagnosis not present

## 2019-07-02 DIAGNOSIS — R8279 Other abnormal findings on microbiological examination of urine: Secondary | ICD-10-CM | POA: Diagnosis not present

## 2019-07-02 DIAGNOSIS — R319 Hematuria, unspecified: Secondary | ICD-10-CM | POA: Diagnosis not present

## 2019-07-03 DIAGNOSIS — R319 Hematuria, unspecified: Secondary | ICD-10-CM | POA: Diagnosis not present

## 2019-07-07 ENCOUNTER — Other Ambulatory Visit: Payer: Self-pay | Admitting: Family

## 2019-08-14 ENCOUNTER — Encounter: Payer: Self-pay | Admitting: Family

## 2019-08-14 ENCOUNTER — Other Ambulatory Visit: Payer: Self-pay

## 2019-08-14 ENCOUNTER — Ambulatory Visit (INDEPENDENT_AMBULATORY_CARE_PROVIDER_SITE_OTHER): Payer: BC Managed Care – PPO | Admitting: Family

## 2019-08-14 VITALS — BP 125/82 | HR 71 | Temp 98.0°F | Ht 63.0 in | Wt 158.0 lb

## 2019-08-14 DIAGNOSIS — I10 Essential (primary) hypertension: Secondary | ICD-10-CM

## 2019-08-14 DIAGNOSIS — L301 Dyshidrosis [pompholyx]: Secondary | ICD-10-CM

## 2019-08-14 DIAGNOSIS — E559 Vitamin D deficiency, unspecified: Secondary | ICD-10-CM

## 2019-08-14 DIAGNOSIS — F411 Generalized anxiety disorder: Secondary | ICD-10-CM

## 2019-08-14 DIAGNOSIS — F17200 Nicotine dependence, unspecified, uncomplicated: Secondary | ICD-10-CM

## 2019-08-14 DIAGNOSIS — F331 Major depressive disorder, recurrent, moderate: Secondary | ICD-10-CM

## 2019-08-14 DIAGNOSIS — E785 Hyperlipidemia, unspecified: Secondary | ICD-10-CM

## 2019-08-14 MED ORDER — LOSARTAN POTASSIUM 50 MG PO TABS: 50.0000 mg | ORAL_TABLET | Freq: Every day | ORAL | 2 refills | Status: DC

## 2019-08-14 MED ORDER — CITALOPRAM HYDROBROMIDE 40 MG PO TABS: 40.0000 mg | ORAL_TABLET | Freq: Every day | ORAL | 2 refills | Status: DC

## 2019-08-14 MED ORDER — ATORVASTATIN CALCIUM 40 MG PO TABS: 40.0000 mg | ORAL_TABLET | Freq: Every day | ORAL | 2 refills | Status: DC

## 2019-08-14 MED ORDER — CLOBETASOL PROPIONATE 0.05 % EX CREA: 1.0000 "application " | TOPICAL_CREAM | Freq: Two times a day (BID) | CUTANEOUS | 2 refills | Status: AC | PRN

## 2019-08-14 MED ORDER — BUSPIRONE HCL 7.5 MG PO TABS: 7.5000 mg | ORAL_TABLET | Freq: Two times a day (BID) | ORAL | 2 refills | Status: DC | PRN

## 2019-08-14 MED ORDER — HYDROCHLOROTHIAZIDE 12.5 MG PO CAPS: 12.5000 mg | ORAL_CAPSULE | Freq: Every day | ORAL | 2 refills | Status: DC

## 2019-08-14 NOTE — Patient Instructions (Signed)
Health Maintenance, Female Adopting a healthy lifestyle and getting preventive care are important in promoting health and wellness. Ask your health care provider about:  The right schedule for you to have regular tests and exams.  Things you can do on your own to prevent diseases and keep yourself healthy. What should I know about diet, weight, and exercise? Eat a healthy diet   Eat a diet that includes plenty of vegetables, fruits, low-fat dairy products, and lean protein.  Do not eat a lot of foods that are high in solid fats, added sugars, or sodium. Maintain a healthy weight Body mass index (BMI) is used to identify weight problems. It estimates body fat based on height and weight. Your health care provider can help determine your BMI and help you achieve or maintain a healthy weight. Get regular exercise Get regular exercise. This is one of the most important things you can do for your health. Most adults should:  Exercise for at least 150 minutes each week. The exercise should increase your heart rate and make you sweat (moderate-intensity exercise).  Do strengthening exercises at least twice a week. This is in addition to the moderate-intensity exercise.  Spend less time sitting. Even light physical activity can be beneficial. Watch cholesterol and blood lipids Have your blood tested for lipids and cholesterol at 52 years of age, then have this test every 5 years. Have your cholesterol levels checked more often if:  Your lipid or cholesterol levels are high.  You are older than 52 years of age.  You are at high risk for heart disease. What should I know about cancer screening? Depending on your health history and family history, you may need to have cancer screening at various ages. This may include screening for:  Breast cancer.  Cervical cancer.  Colorectal cancer.  Skin cancer.  Lung cancer. What should I know about heart disease, diabetes, and high blood  pressure? Blood pressure and heart disease  High blood pressure causes heart disease and increases the risk of stroke. This is more likely to develop in people who have high blood pressure readings, are of African descent, or are overweight.  Have your blood pressure checked: ? Every 3-5 years if you are 18-39 years of age. ? Every year if you are 40 years old or older. Diabetes Have regular diabetes screenings. This checks your fasting blood sugar level. Have the screening done:  Once every three years after age 40 if you are at a normal weight and have a low risk for diabetes.  More often and at a younger age if you are overweight or have a high risk for diabetes. What should I know about preventing infection? Hepatitis B If you have a higher risk for hepatitis B, you should be screened for this virus. Talk with your health care provider to find out if you are at risk for hepatitis B infection. Hepatitis C Testing is recommended for:  Everyone born from 1945 through 1965.  Anyone with known risk factors for hepatitis C. Sexually transmitted infections (STIs)  Get screened for STIs, including gonorrhea and chlamydia, if: ? You are sexually active and are younger than 52 years of age. ? You are older than 52 years of age and your health care provider tells you that you are at risk for this type of infection. ? Your sexual activity has changed since you were last screened, and you are at increased risk for chlamydia or gonorrhea. Ask your health care provider if   you are at risk.  Ask your health care provider about whether you are at high risk for HIV. Your health care provider may recommend a prescription medicine to help prevent HIV infection. If you choose to take medicine to prevent HIV, you should first get tested for HIV. You should then be tested every 3 months for as long as you are taking the medicine. Pregnancy  If you are about to stop having your period (premenopausal) and  you may become pregnant, seek counseling before you get pregnant.  Take 400 to 800 micrograms (mcg) of folic acid every day if you become pregnant.  Ask for birth control (contraception) if you want to prevent pregnancy. Osteoporosis and menopause Osteoporosis is a disease in which the bones lose minerals and strength with aging. This can result in bone fractures. If you are 65 years old or older, or if you are at risk for osteoporosis and fractures, ask your health care provider if you should:  Be screened for bone loss.  Take a calcium or vitamin D supplement to lower your risk of fractures.  Be given hormone replacement therapy (HRT) to treat symptoms of menopause. Follow these instructions at home: Lifestyle  Do not use any products that contain nicotine or tobacco, such as cigarettes, e-cigarettes, and chewing tobacco. If you need help quitting, ask your health care provider.  Do not use street drugs.  Do not share needles.  Ask your health care provider for help if you need support or information about quitting drugs. Alcohol use  Do not drink alcohol if: ? Your health care provider tells you not to drink. ? You are pregnant, may be pregnant, or are planning to become pregnant.  If you drink alcohol: ? Limit how much you use to 0-1 drink a day. ? Limit intake if you are breastfeeding.  Be aware of how much alcohol is in your drink. In the [place] one drink equals one 12 oz bottle of beer (355 mL), one 5 oz glass of wine (148 mL), or one 1 oz glass of hard liquor (44 mL). General instructions  Schedule regular health, dental, and eye exams.  Stay current with your vaccines.  Tell your health care provider if: ? You often feel depressed. ? You have ever been abused or do not feel safe at home. Summary  Adopting a healthy lifestyle and getting preventive care are important in promoting health and wellness.  Follow your health care provider's instructions about healthy  diet, exercising, and getting tested or screened for diseases.  Follow your health care provider's instructions on monitoring your cholesterol and blood pressure. This information is not intended to replace advice given to you by your health care provider. Make sure you discuss any questions you have with your health care provider. Document Revised: 04/02/2018 Document Reviewed: 04/02/2018 Elsevier Patient Education  2020 [employer]  

## 2019-08-14 NOTE — Progress Notes (Signed)
Subjective:    Patient ID: Maria West, female    DOB: [birth year], 54 y.o.   MRN: [id number]  Chief Complaint  Patient presents with  . Medical Management of Chronic Issues   Pt presents to the office today for chronic follow up.  Hypertension This is a chronic problem. The current episode started more than 1 year ago. The problem has been resolved since onset. The problem is controlled. Associated symptoms include anxiety. Pertinent negatives include no malaise/fatigue, peripheral edema or shortness of breath. Risk factors for coronary artery disease include dyslipidemia, obesity and sedentary lifestyle. The current treatment provides moderate improvement.  Hyperlipidemia This is a chronic problem. The current episode started more than 1 year ago. The problem is uncontrolled. Recent lipid tests were reviewed and are high. Exacerbating diseases include obesity. Pertinent negatives include no shortness of breath. Current antihyperlipidemic treatment includes statins. The current treatment provides moderate improvement of lipids. Risk factors for coronary artery disease include dyslipidemia, hypertension and a sedentary lifestyle.  Anxiety Presents for follow-up visit. Symptoms include decreased concentration, excessive worry, insomnia, irritability, nervous/anxious behavior, panic and restlessness. Patient reports no shortness of breath. Symptoms occur most days. The severity of symptoms is moderate.    Depression        This is a chronic problem.  The current episode started more than 1 year ago.   The onset quality is gradual.   The problem occurs intermittently.  Associated symptoms include decreased concentration, insomnia, irritable, restlessness and sad.  Associated symptoms include no helplessness and no hopelessness.  Past treatments include SSRIs - Selective serotonin reuptake inhibitors.  Compliance with treatment is good.  Past medical history includes anxiety.   Nicotine  Dependence Presents for follow-up visit. Symptoms include decreased concentration, insomnia and irritability. Her urge triggers include company of smokers. The symptoms have been stable. She smokes 1 pack of cigarettes per day.      Review of Systems  Constitutional: Positive for irritability. Negative for malaise/fatigue.  Respiratory: Negative for shortness of breath.   Psychiatric/Behavioral: Positive for decreased concentration and depression. The patient is nervous/anxious and has insomnia.   All other systems reviewed and are negative.      Objective:   Physical Exam Vitals reviewed.  Constitutional:      General: She is irritable. She is not in acute distress.    Appearance: She is well-developed.  HENT:     Head: Normocephalic and atraumatic.     Right Ear: Tympanic membrane normal.     Left Ear: Tympanic membrane normal.  Eyes:     Pupils: Pupils are equal, round, and reactive to light.  Neck:     Thyroid: No thyromegaly.  Cardiovascular:     Rate and Rhythm: Normal rate and regular rhythm.     Heart sounds: Normal heart sounds. No murmur.  Pulmonary:     Effort: Pulmonary effort is normal. No respiratory distress.     Breath sounds: Normal breath sounds. No wheezing.  Abdominal:     General: Bowel sounds are normal. There is no distension.     Palpations: Abdomen is soft.     Tenderness: There is no abdominal tenderness.  Musculoskeletal:        General: No tenderness. Normal range of motion.     Cervical back: Normal range of motion and neck supple.  Skin:    General: Skin is warm and dry.  Neurological:     Mental Status: She is alert and oriented to person, place, and  time.     Cranial Nerves: No cranial nerve deficit.     Deep Tendon Reflexes: Reflexes are normal and symmetric.  Psychiatric:        Behavior: Behavior normal.        Thought Content: Thought content normal.        Judgment: Judgment normal.       BP 125/82   Pulse 71   Temp 98 F  (36.7 C) (Temporal)   Ht _0  (1.6 m)   Wt 158 lb (71.7 kg)   LMP 08/18/2009   SpO2 98%   BMI 27.99 kg/m      Assessment & Plan:  Maria West comes in today with chief complaint of Medical Management of Chronic Issues   Diagnosis and orders addressed:  1. GAD (generalized anxiety disorder) - busPIRone (BUSPAR) 7.5 MG tablet; Take 1 tablet (7.5 mg total) by mouth 2 (two) times daily as needed (anxiety.).  Dispense: 90 tablet; Refill: 2 - citalopram (CELEXA) 40 MG tablet; Take 1 tablet (40 mg total) by mouth daily.  Dispense: 90 tablet; Refill: 2 - CMP14+EGFR  2. Moderate episode of recurrent major depressive disorder (HCC) - citalopram (CELEXA) 40 MG tablet; Take 1 tablet (40 mg total) by mouth daily.  Dispense: 90 tablet; Refill: 2 - CMP14+EGFR  3. Dyshidrotic eczema - clobetasol cream (TEMOVATE) 0.05 %; Apply 1 application topically 2 (two) times daily as needed (ezcema on feet).  Dispense: 60 g; Refill: 2 - CMP14+EGFR  4. Hyperlipidemia, unspecified hyperlipidemia type - atorvastatin (LIPITOR) 40 MG tablet; Take 1 tablet (40 mg total) by mouth daily.  Dispense: 90 tablet; Refill: 2 - CMP14+EGFR  5. Essential hypertension - losartan (COZAAR) 50 MG tablet; Take 1 tablet (50 mg total) by mouth daily.  Dispense: 90 tablet; Refill: 2 - CMP14+EGFR  6. Current smoker - CMP14+EGFR  7. Vitamin D deficiency - CMP14+EGFR   Labs pending Health Maintenance reviewed Diet and exercise encouraged  Follow up plan: 6 months  Evelina Dun, FNP

## 2019-08-15 LAB — CMP14+EGFR
ALT: 1131 IU/L (ref 0–32)
AST: 651 IU/L (ref 0–40)
Albumin/Globulin Ratio: 1.7 (ref 1.2–2.2)
Albumin: 4.3 g/dL (ref 3.8–4.9)
Alkaline Phosphatase: 114 IU/L (ref 39–117)
BUN/Creatinine Ratio: 10 (ref 9–23)
BUN: 7 mg/dL (ref 6–24)
Bilirubin Total: 0.5 mg/dL (ref 0.0–1.2)
CO2: 23 mmol/L (ref 20–29)
Calcium: 9.5 mg/dL (ref 8.7–10.2)
Chloride: 103 mmol/L (ref 96–106)
Creatinine, Ser: 0.71 mg/dL (ref 0.57–1.00)
GFR calc Af Amer: 114 mL/min/{1.73_m2} (ref >59)
GFR calc non Af Amer: 99 mL/min/{1.73_m2} (ref >59)
Globulin, Total: 2.5 g/dL (ref 1.5–4.5)
Glucose: 101 mg/dL — ABNORMAL HIGH (ref 65–99)
Potassium: 4.1 mmol/L (ref 3.5–5.2)
Sodium: 140 mmol/L (ref 134–144)
Total Protein: 6.8 g/dL (ref 6.0–8.5)

## 2019-08-17 ENCOUNTER — Other Ambulatory Visit: Payer: Self-pay | Admitting: Family

## 2019-08-17 DIAGNOSIS — R748 Abnormal levels of other serum enzymes: Secondary | ICD-10-CM

## 2019-08-18 ENCOUNTER — Other Ambulatory Visit: Payer: Self-pay

## 2019-08-18 ENCOUNTER — Encounter: Payer: Self-pay | Admitting: Gastroenterology

## 2019-08-18 ENCOUNTER — Emergency Department (HOSPITAL_COMMUNITY)
Admission: EM | Admit: 2019-08-18 | Discharge: 2019-08-18 | Disposition: A | Discharge status: Home / Self Care | Payer: BC Managed Care – PPO | Attending: Emergency Medicine | Admitting: Emergency Medicine

## 2019-08-18 ENCOUNTER — Encounter (HOSPITAL_COMMUNITY): Payer: Self-pay | Admitting: Emergency Medicine

## 2019-08-18 ENCOUNTER — Emergency Department (HOSPITAL_COMMUNITY): Payer: BC Managed Care – PPO

## 2019-08-18 ENCOUNTER — Other Ambulatory Visit: Payer: Self-pay | Admitting: Family

## 2019-08-18 DIAGNOSIS — F17210 Nicotine dependence, cigarettes, uncomplicated: Secondary | ICD-10-CM | POA: Diagnosis not present

## 2019-08-18 DIAGNOSIS — I10 Essential (primary) hypertension: Secondary | ICD-10-CM | POA: Insufficient documentation

## 2019-08-18 DIAGNOSIS — Z79899 Other long term (current) drug therapy: Secondary | ICD-10-CM | POA: Diagnosis not present

## 2019-08-18 DIAGNOSIS — R748 Abnormal levels of other serum enzymes: Secondary | ICD-10-CM

## 2019-08-18 DIAGNOSIS — K760 Fatty (change of) liver, not elsewhere classified: Secondary | ICD-10-CM

## 2019-08-18 LAB — COMPREHENSIVE METABOLIC PANEL
ALT: 865 U/L — ABNORMAL HIGH (ref 0–44)
AST: 469 U/L — ABNORMAL HIGH (ref 15–41)
Albumin: 4.3 g/dL (ref 3.5–5.0)
Alkaline Phosphatase: 104 U/L (ref 38–126)
Anion gap: 12 (ref 5–15)
BUN: 13 mg/dL (ref 6–20)
CO2: 23 mmol/L (ref 22–32)
Calcium: 9.4 mg/dL (ref 8.9–10.3)
Chloride: 102 mmol/L (ref 98–111)
Creatinine, Ser: 0.54 mg/dL (ref 0.44–1.00)
GFR calc Af Amer: >60 mL/min (ref >60)
GFR calc non Af Amer: >60 mL/min (ref >60)
Glucose, Bld: 94 mg/dL (ref 70–99)
Potassium: 3.7 mmol/L (ref 3.5–5.1)
Sodium: 137 mmol/L (ref 135–145)
Total Bilirubin: 0.9 mg/dL (ref 0.3–1.2)
Total Protein: 7.7 g/dL (ref 6.5–8.1)

## 2019-08-18 LAB — HEPATITIS PANEL, ACUTE
HCV Ab: NONREACTIVE
Hep A IgM: NONREACTIVE
Hep B C IgM: NONREACTIVE
Hepatitis B Surface Ag: NONREACTIVE

## 2019-08-18 LAB — URINALYSIS, ROUTINE W REFLEX MICROSCOPIC
Bacteria, UA: NONE SEEN
Bilirubin Urine: NEGATIVE
Glucose, UA: NEGATIVE mg/dL
Ketones, ur: NEGATIVE mg/dL
Leukocytes,Ua: NEGATIVE
Nitrite: NEGATIVE
Protein, ur: NEGATIVE mg/dL
Specific Gravity, Urine: 1.004 — ABNORMAL LOW (ref 1.005–1.030)
pH: 6 (ref 5.0–8.0)

## 2019-08-18 LAB — CBC WITH DIFFERENTIAL/PLATELET
Abs Immature Granulocytes: 0.01 10*3/uL (ref 0.00–0.07)
Basophils Absolute: 0 10*3/uL (ref 0.0–0.1)
Basophils Relative: 1 %
Eosinophils Absolute: 0.1 10*3/uL (ref 0.0–0.5)
Eosinophils Relative: 1 %
HCT: 46.9 % — ABNORMAL HIGH (ref 36.0–46.0)
Hemoglobin: 15.3 g/dL — ABNORMAL HIGH (ref 12.0–15.0)
Immature Granulocytes: 0 %
Lymphocytes Relative: 21 %
Lymphs Abs: 1.3 10*3/uL (ref 0.7–4.0)
MCH: 32.1 pg (ref 26.0–34.0)
MCHC: 32.6 g/dL (ref 30.0–36.0)
MCV: 98.5 fL (ref 80.0–100.0)
Monocytes Absolute: 0.7 10*3/uL (ref 0.1–1.0)
Monocytes Relative: 12 %
Neutro Abs: 4 10*3/uL (ref 1.7–7.7)
Neutrophils Relative %: 65 %
Platelets: 222 10*3/uL (ref 150–400)
RBC: 4.76 MIL/uL (ref 3.87–5.11)
RDW: 13.6 % (ref 11.5–15.5)
WBC: 6.1 10*3/uL (ref 4.0–10.5)
nRBC: 0 % (ref 0.0–0.2)

## 2019-08-18 LAB — PROTIME-INR
INR: 1 (ref 0.8–1.2)
Prothrombin Time: 12.7 seconds (ref 11.4–15.2)

## 2019-08-18 LAB — ACETAMINOPHEN LEVEL: Acetaminophen (Tylenol), Serum: <10 ug/mL — ABNORMAL LOW (ref 10–30)

## 2019-08-18 LAB — LIPASE, BLOOD: Lipase: 47 U/L (ref 11–51)

## 2019-08-18 MED ORDER — IOHEXOL 300 MG/ML  SOLN
100.0000 mL | Freq: Once | INTRAMUSCULAR | Status: AC | PRN
  Administered 2019-08-18: 13:00:00 100 mL via INTRAVENOUS

## 2019-08-18 NOTE — Discharge Instructions (Signed)
As discussed your liver enzymes are elevated again today but they are improving from the results from Friday.  It is very important for you to avoid taking any Tylenol at this time.  Additionally the more alcohol you drink the more you will be affecting your liver and potentially making this inflammation worse.  You will need follow-up with Dr. Darrick Penna for further evaluation, call her office for an appointment as discussed.

## 2019-08-18 NOTE — ED Provider Notes (Signed)
[facility] EMERGENCY DEPARTMENT Provider Note   CSN: [id number] Arrival date & time: 08/18/19  0813     History No chief complaint on file.   Maria West is a 54 y.o. female with a history of hypertension, hyperlipidemia, generalized anxiety disorder, depression who also endorses to shots of EtOH nightly chronically presenting for evaluation of elevated liver enzymes.  She was seen by her PCP 4 days ago for a general checkup and labs obtained that day were revealing for elevated liver enzymes with her AST 651 and her ALT 1131.  She was called and advised to present here for further evaluation.  She denies nausea or vomiting, no abdominal pain, jaundice, diarrhea or any symptoms.  She also denies Tylenol use, no IV drug use.  No risk factors for infectious hepatitis. Denies jaundice or unusual bleeding/bruising.       651 [id number]  The history is provided by the patient.       Past Medical History:  Diagnosis Date  . Depression   . Generalized anxiety disorder   . Hyperlipidemia   . Hypertension     Patient Active Problem List   Diagnosis Date Noted  . Colon cancer screening 03/16/2019  . Polypharmacy 03/16/2019  . Dyshidrotic eczema 02/12/2019  . GAD (generalized anxiety disorder) 08/22/2015  . Current smoker 02/22/2015  . Hyperlipidemia 02/07/2015  . Vitamin D deficiency 02/07/2015  . Essential hypertension 02/07/2015  . Cervical radiculopathy 09/30/2012  . Depression 08/18/2012    Past Surgical History:  Procedure Laterality Date  . COLONOSCOPY WITH PROPOFOL N/A 06/30/2019   Procedure: COLONOSCOPY WITH PROPOFOL;  Surgeon: West Bali, MD;  Location: [facility];  Service: Endoscopy;  Laterality: N/A;  10:45am  . LEG SURGERY Bilateral 1990s   cysts in figula heads     OB History   No obstetric history on file.     Family History  Problem Relation Age of Onset  . Diabetes Mother   . Colon cancer Neg Hx     Social History   Tobacco Use  .  Smoking status: Current Every Day Smoker    Packs/day: 1.00    Types: Cigarettes    Start date: 04/23/1984  . Smokeless tobacco: Never Used  Substance Use Topics  . Alcohol use: Yes    Comment: 2-3 shots whisky per night  . Drug use: No    Home Medications Prior to Admission medications   Medication Sig Start Date End Date Taking? Authorizing Provider  atorvastatin (LIPITOR) 40 MG tablet Take 1 tablet (40 mg total) by mouth daily. 08/14/19  Yes Hawks, Christy A, FNP  busPIRone (BUSPAR) 7.5 MG tablet Take 1 tablet (7.5 mg total) by mouth 2 (two) times daily as needed (anxiety.). 08/14/19  Yes Hawks, Christy A, FNP  citalopram (CELEXA) 40 MG tablet Take 1 tablet (40 mg total) by mouth daily. Patient taking differently: Take 20 mg by mouth daily.  08/14/19  Yes Hawks, Christy A, FNP  clobetasol cream (TEMOVATE) 0.05 % Apply 1 application topically 2 (two) times daily as needed (ezcema on feet). 08/14/19  Yes Hawks, Christy A, FNP  cyanocobalamin 1000 MCG tablet Take 1,000 mcg by mouth daily.   Yes [provider]  cyclobenzaprine (FLEXERIL) 10 MG tablet Take 10 mg by mouth 3 (three) times daily as needed. 07/02/19  Yes [provider]  hydrochlorothiazide (MICROZIDE) 12.5 MG capsule Take 1 capsule (12.5 mg total) by mouth daily. 08/14/19  Yes Hawks, Christy A, FNP  losartan (COZAAR) 50  MG tablet Take 1 tablet (50 mg total) by mouth daily. 08/14/19  Yes Hawks, Christy A, FNP  Vitamin D, Ergocalciferol, (DRISDOL) 1.25 MG (50000 UNIT) CAPS capsule Take 1 capsule by mouth once a week Patient taking differently: Take 50,000 Units by mouth every Sunday.  06/08/19  Yes Hawks, Neysa Bonito A, FNP    Allergies    Penicillins  Review of Systems   Review of Systems  Constitutional: Negative for chills and fever.  HENT: Negative.   Eyes: Negative.   Respiratory: Negative for chest tightness and shortness of breath.   Cardiovascular: Negative for chest pain.  Gastrointestinal: Negative for  abdominal distention, abdominal pain, diarrhea, nausea and vomiting.  Genitourinary: Negative.   Musculoskeletal: Negative for arthralgias, joint swelling and neck pain.  Skin: Negative.  Negative for color change, rash and wound.  Neurological: Negative for dizziness, weakness, light-headedness, numbness and headaches.  Hematological: Does not bruise/bleed easily.  Psychiatric/Behavioral: Negative.     Physical Exam Updated Vital Signs BP 128/76 (BP Location: Left Arm)   Pulse 66   Temp 98.6 F (37 C) (Oral)   Resp 17   Ht 5\' 3"  (1.6 m)   Wt 71.7 kg   LMP 08/18/2009   SpO2 96%   BMI 27.99 kg/m   Physical Exam Vitals and nursing note reviewed.  Constitutional:      Appearance: She is well-developed.  HENT:     Head: Normocephalic and atraumatic.  Eyes:     General: No scleral icterus.    Conjunctiva/sclera: Conjunctivae normal.  Cardiovascular:     Rate and Rhythm: Normal rate and regular rhythm.     Pulses: Normal pulses.     Heart sounds: Normal heart sounds.  Pulmonary:     Effort: Pulmonary effort is normal. No respiratory distress.     Breath sounds: Normal breath sounds. No wheezing.  Abdominal:     General: Bowel sounds are normal. There is no distension.     Palpations: Abdomen is soft.     Tenderness: There is no abdominal tenderness. There is no guarding.  Musculoskeletal:        General: Normal range of motion.     Cervical back: Normal range of motion.  Skin:    General: Skin is warm and dry.     Coloration: Skin is not jaundiced.  Neurological:     Mental Status: She is alert.     ED Results / Procedures / Treatments   Labs (all labs ordered are listed, but only abnormal results are displayed) Labs Reviewed  COMPREHENSIVE METABOLIC PANEL - Abnormal; Notable for the following components:      Result Value   AST 469 (*)    ALT 865 (*)    All other components within normal limits  ACETAMINOPHEN LEVEL - Abnormal; Notable for the following  components:   Acetaminophen (Tylenol), Serum <10 (*)    All other components within normal limits  CBC WITH DIFFERENTIAL/PLATELET - Abnormal; Notable for the following components:   Hemoglobin 15.3 (*)    HCT 46.9 (*)    All other components within normal limits  URINALYSIS, ROUTINE W REFLEX MICROSCOPIC - Abnormal; Notable for the following components:   Color, Urine STRAW (*)    Specific Gravity, Urine 1.004 (*)    Hgb urine dipstick SMALL (*)    All other components within normal limits  PROTIME-INR  LIPASE, BLOOD  HEPATITIS PANEL, ACUTE    EKG None  Radiology CT ABDOMEN PELVIS W CONTRAST  Result Date:  08/18/2019 CLINICAL DATA:  Elevated transaminases, fatigue EXAM: CT ABDOMEN AND PELVIS WITH CONTRAST TECHNIQUE: Multidetector CT imaging of the abdomen and pelvis was performed using the standard protocol following bolus administration of intravenous contrast. CONTRAST:  183mL OMNIPAQUE IOHEXOL 300 MG/ML  SOLN COMPARISON:  None. FINDINGS: Lower chest: No acute abnormality.  Coronary artery calcifications. Hepatobiliary: No solid liver abnormality is seen. Hepatic steatosis. No gallstones, gallbladder wall thickening, or biliary dilatation. Pancreas: Unremarkable. No pancreatic ductal dilatation or surrounding inflammatory changes. Spleen: Normal in size without significant abnormality. Adrenals/Urinary Tract: Adrenal glands are unremarkable. Kidneys are normal, without renal calculi, solid lesion, or hydronephrosis. Bladder is unremarkable. Stomach/Bowel: Stomach is within normal limits. Appendix appears normal. The colon appears mildly thickened with vascular combing, particularly with a somewhat featureless appearance of the descending and sigmoid colon (series 2, image 68, series 5, image 36). Vascular/Lymphatic: Aortic atherosclerosis. No enlarged abdominal or pelvic lymph nodes. Reproductive: No mass or other significant abnormality. Other: No abdominal wall hernia or abnormality. No  abdominopelvic ascites. Musculoskeletal: No acute or significant osseous findings. IMPRESSION: 1. No acute CT findings of the abdomen or pelvis to explain abnormal LFTs. 2.  Hepatic steatosis. 3. The colon appears mildly thickened with vascular combing, particularly with a somewhat featureless appearance of the descending and sigmoid colon. This appearance is generally suggestive of a nonspecific infectious or inflammatory colitis, including inflammatory bowel disease such as ulcerative colitis. Correlate with referable clinical signs and symptoms, if present. 4.  Coronary artery disease.  Aortic Atherosclerosis (ICD10-I70.0). Electronically Signed   By: Eddie Candle M.D.   On: 08/18/2019 13:04    Procedures Procedures (including critical care time)  Medications Ordered in ED Medications  iohexol (OMNIPAQUE) 300 MG/ML solution 100 mL (100 mLs Intravenous Contrast Given 08/18/19 1230)    ED Course  I have reviewed the triage vital signs and the nursing notes.  Pertinent labs & imaging results that were available during my care of the patient were reviewed by me and considered in my medical decision making (see chart for details).    MDM Rules/Calculators/A&P                      Labs and imaging reviewed and discussed with patient.  She still has elevated LFTs but they are improved from the LFTs that were drawn 4 days ago by her PCP.  Patient is asymptomatic.  CT results were reviewed including the suggestion of inflammatory colitis versus ulcerative colitis.  Patient has no GI complaints and she underwent a screening colonoscopy just last month with Dr. Oneida Alar which was a normal study.  She was given a referral back to Dr. Oneida Alar given her elevated transaminases and her liver steatosis.  Advised patient to avoid any Tylenol and to strongly consider abstinence from alcohol as well.  As needed follow-up with Dr. Oneida Alar recommended which was discussed with patient and is agreeable with this  plan. Final Clinical Impression(s) / ED Diagnoses Final diagnoses:  Elevated liver enzymes  Hepatic steatosis    Rx / DC Orders ED Discharge Orders    None       Landis Martins 08/18/19 1337    Elnora Morrison, MD 08/21/19 1544

## 2019-08-18 NOTE — ED Triage Notes (Signed)
Patient states she had blood work drawn on Friday and was called by primary care MD last night and told her she needed to come to the ER her liver enzymes were elevated.  Patient states she has no symptoms other than feeling tired.  No pain.

## 2019-08-31 ENCOUNTER — Encounter: Payer: Self-pay | Admitting: Gastroenterology

## 2019-08-31 ENCOUNTER — Other Ambulatory Visit: Payer: Self-pay | Admitting: Family

## 2019-08-31 DIAGNOSIS — E559 Vitamin D deficiency, unspecified: Secondary | ICD-10-CM

## 2019-08-31 NOTE — Progress Notes (Signed)
Primary Care Physician:  Sharion Balloon, FNP Primary GI:  Formerly Barney Drain MD   Patient Location: Home  Provider Location: [facility] office  Reason for Phone Visit:  Chief Complaint  Patient presents with  . Elevated Hepatic Enzymes   Persons present on the phone encounter, with roles: Patient, myself (provider),Mindy Estudillo CMA (updated meds and allergies)  Total time (minutes) spent on medical discussion: 12 minutes  Due to COVID-19, visit was conducted using telephonic method (no video was available).  Visit was requested by patient.  Virtual Visit via Telephone only  I connected with Maria West on 08/31/19 at  8:00 AM EDT by telephone and verified that I am speaking with the correct person using two identifiers.   I discussed the limitations, risks, security and privacy concerns of performing an evaluation and management service by telephone and the availability of in person appointments. I also discussed with the patient that there may be a patient responsible charge related to this service. The patient expressed understanding and agreed to proceed.   HPI:   Patient is a pleasant 54 y/o who presents for telephone visit regarding abnormal LFTs. Patient was seen back in 06/2019 for screening colonoscopy. She had tortuous colon, internal/external hemorrhoids. Next colonoscopy planned in 10 years.   Recently had routine labs as outpatient on 4/23 and noted to have AST 651, ALT 1131 but total bili and alk phos were normal. LFTs in 01/2019 were normal except ALT of 42. She was referred to ED for evaluation. Repeat labs in the ED on 08/18/19 showed normal LFTS except AST 469 and ALT 865.Acetaminophen level <10. Hep B surface Ag neg, Hep B C IgM neg, Hep A IgM neg, HCV Ab neg.  INR 1.0.   CT A/P with contrast while in the ED on 08/18/19 showed hepatic steatosis, colon appeared mildly thickened with vascular combing, somewhat featureless appearance of descending and sigmoid  colon which can be seen with nonspecific infectious or inflammatory colitis including IBD.   Patient feels fine. No abdominal pain. No change in bowels or stool colors. No diarrhea. No fever, no unusual fatigue. Chronically feels tired but nothing new. Appetite good. No vomiting. No diarrhea. No new medications. OTC B12. No change in prescriptions. No ill contacts. No travel. No new sexual partners. No blood transfusion. She has been under a lot of stress. Dad died in car wreck in 2/202. Mother CHF/MI two weeks later. Maybe drank a little more than usual.     Current Outpatient Medications  Medication Sig Dispense Refill  . atorvastatin (LIPITOR) 40 MG tablet Take 1 tablet (40 mg total) by mouth daily. 90 tablet 2  . busPIRone (BUSPAR) 7.5 MG tablet Take 1 tablet (7.5 mg total) by mouth 2 (two) times daily as needed (anxiety.). 90 tablet 2  . citalopram (CELEXA) 40 MG tablet Take 1 tablet (40 mg total) by mouth daily. (Patient taking differently: Take 20 mg by mouth daily. ) 90 tablet 2  . clobetasol cream (TEMOVATE) 1.02 % Apply 1 application topically 2 (two) times daily as needed (ezcema on feet). 60 g 2  . cyanocobalamin 1000 MCG tablet Take 1,000 mcg by mouth daily.    . cyclobenzaprine (FLEXERIL) 10 MG tablet Take 10 mg by mouth 3 (three) times daily as needed.    . hydrochlorothiazide (MICROZIDE) 12.5 MG capsule Take 1 capsule (12.5 mg total) by mouth daily. 90 capsule 2  . losartan (COZAAR) 50 MG tablet Take 1 tablet (50 mg  total) by mouth daily. 90 tablet 2  . Vitamin D, Ergocalciferol, (DRISDOL) 1.25 MG (50000 UNIT) CAPS capsule Take 1 capsule by mouth once a week 12 capsule 0   No current facility-administered medications for this visit.    Past Medical History:  Diagnosis Date  . Depression   . Generalized anxiety disorder   . Hyperlipidemia   . Hypertension     Past Surgical History:  Procedure Laterality Date  . COLONOSCOPY WITH PROPOFOL N/A 06/30/2019   Fields: tortuous  colon, int/ext hemorrhoids. next colonoscopy in 10 years.   . LEG SURGERY Bilateral 1990s   cysts in figula heads    Family History  Problem Relation Age of Onset  . Diabetes Mother   . Colon cancer Neg Hx     Social History   Socioeconomic History  . Marital status: Single    Spouse name: Not on file  . Number of children: Not on file  . Years of education: Not on file  . Highest education level: Not on file  Occupational History  . Not on file  Tobacco Use  . Smoking status: Current Every Day Smoker    Packs/day: 1.00    Types: Cigarettes    Start date: 04/23/1984  . Smokeless tobacco: Never Used  Substance and Sexual Activity  . Alcohol use: Yes    Comment: 2-3 shots whisky per night  . Drug use: No  . Sexual activity: Not on file  Other Topics Concern  . Not on file  Social History Narrative  . Not on file   Social Determinants of Health   Financial Resource Strain:   . Difficulty of Paying Living Expenses:   Food Insecurity:   . Worried About Charity fundraiser in the Last Year:   . Arboriculturist in the Last Year:   Transportation Needs:   . Film/video editor (Medical):   Marland Kitchen Lack of Transportation (Non-Medical):   Physical Activity:   . Days of Exercise per Week:   . Minutes of Exercise per Session:   Stress:   . Feeling of Stress :   Social Connections:   . Frequency of Communication with Friends and Family:   . Frequency of Social Gatherings with Friends and Family:   . Attends Religious Services:   . Active Member of Clubs or Organizations:   . Attends Archivist Meetings:   Marland Kitchen Marital Status:   Intimate Partner Violence:   . Fear of Current or Ex-Partner:   . Emotionally Abused:   Marland Kitchen Physically Abused:   . Sexually Abused:       ROS:  General: Negative for anorexia, weight loss, fever, chills, fatigue, weakness. Eyes: Negative for vision changes.  ENT: Negative for hoarseness, difficulty swallowing , nasal congestion. CV:  Negative for chest pain, angina, palpitations, dyspnea on exertion, peripheral edema.  Respiratory: Negative for dyspnea at rest, dyspnea on exertion, cough, sputum, wheezing.  GI: See history of present illness. GU:  Negative for dysuria, hematuria, urinary incontinence, urinary frequency, nocturnal urination.  MS: Negative for joint pain, low back pain.  Derm: Negative for rash or itching.  Neuro: Negative for weakness, abnormal sensation, seizure, frequent headaches, memory loss, confusion.  Psych: Negative for anxiety, depression, suicidal ideation, hallucinations.  Endo: Negative for unusual weight change.  Heme: Negative for bruising or bleeding. Allergy: Negative for rash or hives.   Observations/Objective: Pleasant and cooperative over the phone.  Otherwise exam unavailable.  See labs mentioned above.  Assessment  and Plan: Pleasant 54 year old female with new onset abnormal AST/ALT as outlined above.  CT findings showing hepatic steatosis but nothing to explain the degree of LFT abnormality.  Acute hepatitis panel negative recently.  No risk factors for viral hepatitis B or C.  Denies any changes in medications.  AST/ALT elevation out of proportion from what one would expect from alcohol consumption.  At this time we will plan to repeat LFTs.  Check iron/ferritin, autoimmune markers, repeat hepatitis A.  Consider repeating hepatitis B and C in several weeks to ensure we did not miss an acute viral hepatitis (although low risk for B/C given lack of risk factors).   With regards to abnormal colon seen on CT, she had a colonoscopy 06/2019 which was unremarkable.  She is asymptomatic, no abdominal pain or diarrhea.  Will review CT with radiologist.  Follow Up Instructions:    I discussed the assessment and treatment plan with the patient. The patient was provided an opportunity to ask questions and all were answered. The patient agreed with the plan and demonstrated an understanding of the  instructions. AVS mailed to patient's home address.   The patient was advised to call back or seek an in-person evaluation if the symptoms worsen or if the condition fails to improve as anticipated.  I provided 12 minutes of non-face-to-face time during this encounter.   Neil Crouch, PA-C

## 2019-09-01 ENCOUNTER — Other Ambulatory Visit: Payer: Self-pay

## 2019-09-01 ENCOUNTER — Encounter: Payer: Self-pay | Admitting: Gastroenterology

## 2019-09-01 ENCOUNTER — Ambulatory Visit (INDEPENDENT_AMBULATORY_CARE_PROVIDER_SITE_OTHER): Payer: BC Managed Care – PPO | Admitting: Gastroenterology

## 2019-09-01 DIAGNOSIS — R7989 Other specified abnormal findings of blood chemistry: Secondary | ICD-10-CM

## 2019-09-01 DIAGNOSIS — R945 Abnormal results of liver function studies: Secondary | ICD-10-CM

## 2019-09-01 NOTE — Patient Instructions (Signed)
1. Please go for labs as discussed. We will contact you with results as available.  2. Call if you develop any abdominal pain, vomiting, diarrhea, fever.

## 2019-09-16 ENCOUNTER — Telehealth: Payer: Self-pay | Admitting: *Deleted

## 2019-09-16 NOTE — Telephone Encounter (Addendum)
Pt consented to a telephone visit on 09/01/19. 

## 2019-09-16 NOTE — Telephone Encounter (Signed)
Maria West, you are scheduled for a virtual visit with your provider today.  Just as we do with appointments in the office, we must obtain your consent to participate.  Your consent will be active for this visit and any virtual visit you may have with one of our providers in the next 365 days.  If you have a MyChart account, I can also send a copy of this consent to you electronically.  All virtual visits are billed to your insurance company just like a traditional visit in the office.  As this is a virtual visit, video technology does not allow for your provider to perform a traditional examination.  This may limit your provider's ability to fully assess your condition.  If your provider identifies any concerns that need to be evaluated in person or the need to arrange testing such as labs, EKG, etc, we will make arrangements to do so.  Although advances in technology are sophisticated, we cannot ensure that it will always work on either your end or our end.  If the connection with a video visit is poor, we may have to switch to a telephone visit.  With either a video or telephone visit, we are not always able to ensure that we have a secure connection.   I need to obtain your verbal consent now.   Are you willing to proceed with your visit today?

## 2019-09-22 DIAGNOSIS — R945 Abnormal results of liver function studies: Secondary | ICD-10-CM | POA: Diagnosis not present

## 2019-09-23 ENCOUNTER — Other Ambulatory Visit: Payer: Self-pay | Admitting: Family

## 2019-09-23 ENCOUNTER — Telehealth: Payer: Self-pay | Admitting: Gastroenterology

## 2019-09-23 DIAGNOSIS — E785 Hyperlipidemia, unspecified: Secondary | ICD-10-CM

## 2019-09-23 LAB — HEPATITIS A ANTIBODY, IGM: Hep A IgM: NEGATIVE

## 2019-09-23 LAB — COMPREHENSIVE METABOLIC PANEL
ALT: 39 IU/L — ABNORMAL HIGH (ref 0–32)
AST: 36 IU/L (ref 0–40)
Albumin/Globulin Ratio: 2.1 (ref 1.2–2.2)
Albumin: 4.8 g/dL (ref 3.8–4.9)
Alkaline Phosphatase: 90 IU/L (ref 48–121)
BUN/Creatinine Ratio: 24 — ABNORMAL HIGH (ref 9–23)
BUN: 17 mg/dL (ref 6–24)
Bilirubin Total: 0.3 mg/dL (ref 0.0–1.2)
CO2: 23 mmol/L (ref 20–29)
Calcium: 9.9 mg/dL (ref 8.7–10.2)
Chloride: 102 mmol/L (ref 96–106)
Creatinine, Ser: 0.72 mg/dL (ref 0.57–1.00)
GFR calc Af Amer: 112 mL/min/{1.73_m2} (ref >59)
GFR calc non Af Amer: 97 mL/min/{1.73_m2} (ref >59)
Globulin, Total: 2.3 g/dL (ref 1.5–4.5)
Glucose: 85 mg/dL (ref 65–99)
Potassium: 4.4 mmol/L (ref 3.5–5.2)
Sodium: 140 mmol/L (ref 134–144)
Total Protein: 7.1 g/dL (ref 6.0–8.5)

## 2019-09-23 LAB — CBC WITH DIFFERENTIAL/PLATELET
Basophils Absolute: 0 10*3/uL (ref 0.0–0.2)
Basos: 1 %
EOS (ABSOLUTE): 0.1 10*3/uL (ref 0.0–0.4)
Eos: 2 %
Hematocrit: 43.6 % (ref 34.0–46.6)
Hemoglobin: 14.9 g/dL (ref 11.1–15.9)
Immature Grans (Abs): 0 10*3/uL (ref 0.0–0.1)
Immature Granulocytes: 0 %
Lymphocytes Absolute: 2.4 10*3/uL (ref 0.7–3.1)
Lymphs: 38 %
MCH: 32.3 pg (ref 26.6–33.0)
MCHC: 34.2 g/dL (ref 31.5–35.7)
MCV: 94 fL (ref 79–97)
Monocytes Absolute: 0.6 10*3/uL (ref 0.1–0.9)
Monocytes: 10 %
Neutrophils Absolute: 3.2 10*3/uL (ref 1.4–7.0)
Neutrophils: 49 %
Platelets: 170 10*3/uL (ref 150–450)
RBC: 4.62 x10E6/uL (ref 3.77–5.28)
RDW: 12.9 % (ref 11.7–15.4)
WBC: 6.3 10*3/uL (ref 3.4–10.8)

## 2019-09-23 LAB — MITOCHONDRIAL/SMOOTH MUSCLE AB PNL
Mitochondrial Ab: <20 Units (ref 0.0–20.0)
Smooth Muscle Ab: 12 Units (ref 0–19)

## 2019-09-23 LAB — ANA: Anti Nuclear Antibody (ANA): NEGATIVE

## 2019-09-23 LAB — PROTIME-INR
INR: 1 (ref 0.9–1.2)
Prothrombin Time: 10.6 s (ref 9.1–12.0)

## 2019-09-23 LAB — HEPATITIS A ANTIBODY, TOTAL: hep A Total Ab: POSITIVE — AB

## 2019-09-23 LAB — IRON,TIBC AND FERRITIN PANEL
Ferritin: 268 ng/mL — ABNORMAL HIGH (ref 15–150)
Iron Saturation: 36 % (ref 15–55)
Iron: 125 ug/dL (ref 27–159)
Total Iron Binding Capacity: 347 ug/dL (ref 250–450)
UIBC: 222 ug/dL (ref 131–425)

## 2019-09-23 LAB — IGG, IGA, IGM
IgA/Immunoglobulin A, Serum: 132 mg/dL (ref 87–352)
IgG (Immunoglobin G), Serum: 952 mg/dL (ref 586–1602)
IgM (Immunoglobulin M), Srm: 54 mg/dL (ref 26–217)

## 2019-09-23 NOTE — Telephone Encounter (Signed)
noted 

## 2019-09-23 NOTE — Telephone Encounter (Signed)
Pt called to let LL know that she had her labs done yesterday.

## 2019-09-28 ENCOUNTER — Telehealth: Payer: Self-pay | Admitting: Gastroenterology

## 2019-09-28 NOTE — Telephone Encounter (Signed)
Pt said she had labs done last Tuesday and was asking if her results were available. [phone]

## 2019-09-28 NOTE — Telephone Encounter (Signed)
Pt is inquiring of lab results.  

## 2019-09-29 NOTE — Telephone Encounter (Signed)
Maria West is out this week.   From review, transaminases significantly improved and nearly normalized. ALT is just marginally elevated. They have nearly returned to baseline. AST normal. This is a vast improvement.  Ferritin elevated at 268, iron sats 36. Further evaluation per Maria West.   AMA, ANA, immunoglobulins normal.   Further recommendations when Maria West returns Monday.

## 2019-09-29 NOTE — Telephone Encounter (Signed)
Spoke with pt. Pt notified of results and will look for further recommendations from [facility] when she returns.

## 2019-10-05 ENCOUNTER — Other Ambulatory Visit: Payer: Self-pay | Admitting: Emergency Medicine

## 2019-10-05 DIAGNOSIS — R7989 Other specified abnormal findings of blood chemistry: Secondary | ICD-10-CM

## 2019-12-09 ENCOUNTER — Other Ambulatory Visit: Payer: Self-pay | Admitting: Family

## 2019-12-09 DIAGNOSIS — E559 Vitamin D deficiency, unspecified: Secondary | ICD-10-CM

## 2019-12-22 ENCOUNTER — Other Ambulatory Visit: Payer: Self-pay | Admitting: Family

## 2019-12-22 DIAGNOSIS — E785 Hyperlipidemia, unspecified: Secondary | ICD-10-CM

## 2019-12-31 ENCOUNTER — Encounter: Payer: Self-pay | Admitting: Gastroenterology

## 2019-12-31 ENCOUNTER — Telehealth: Payer: Self-pay | Admitting: Gastroenterology

## 2019-12-31 ENCOUNTER — Other Ambulatory Visit: Payer: Self-pay

## 2019-12-31 DIAGNOSIS — R7989 Other specified abnormal findings of blood chemistry: Secondary | ICD-10-CM

## 2019-12-31 NOTE — Telephone Encounter (Signed)
Please send pt reminder to have labs done and schedule for routine ov for abnormal lfts

## 2019-12-31 NOTE — Telephone Encounter (Signed)
Lab orders mailed to pt.

## 2019-12-31 NOTE — Telephone Encounter (Signed)
SENT LETTER TO CALL FOR APPOINTMENT TO PATIENT

## 2020-01-29 ENCOUNTER — Other Ambulatory Visit: Payer: Self-pay

## 2020-01-29 DIAGNOSIS — E785 Hyperlipidemia, unspecified: Secondary | ICD-10-CM

## 2020-01-29 MED ORDER — ATORVASTATIN CALCIUM 40 MG PO TABS: 40.0000 mg | ORAL_TABLET | Freq: Every day | ORAL | 0 refills | Status: DC

## 2020-01-29 NOTE — Telephone Encounter (Signed)
  Prescription Request  01/29/2020  What is the name of the medication or equipment? Cholesterol meds. Has televisit on oct 14  Have you contacted your pharmacy to request a refill? (if applicable) yes  Which pharmacy would you like this sent to? [facility]   Patient notified that their request is being sent to the clinical staff for review and that they should receive a response within 2 business days.

## 2020-01-29 NOTE — Telephone Encounter (Signed)
LMOVM refill sent to pharmacy 

## 2020-02-04 ENCOUNTER — Ambulatory Visit (INDEPENDENT_AMBULATORY_CARE_PROVIDER_SITE_OTHER): Payer: BC Managed Care – PPO | Admitting: Family

## 2020-02-04 ENCOUNTER — Encounter: Payer: Self-pay | Admitting: Family

## 2020-02-04 VITALS — BP 130/90

## 2020-02-04 DIAGNOSIS — I10 Essential (primary) hypertension: Secondary | ICD-10-CM

## 2020-02-04 DIAGNOSIS — F331 Major depressive disorder, recurrent, moderate: Secondary | ICD-10-CM

## 2020-02-04 DIAGNOSIS — F411 Generalized anxiety disorder: Secondary | ICD-10-CM | POA: Diagnosis not present

## 2020-02-04 DIAGNOSIS — F17200 Nicotine dependence, unspecified, uncomplicated: Secondary | ICD-10-CM | POA: Diagnosis not present

## 2020-02-04 DIAGNOSIS — E785 Hyperlipidemia, unspecified: Secondary | ICD-10-CM

## 2020-02-04 NOTE — Progress Notes (Addendum)
Virtual Visit via telephone Note Due to COVID-19 pandemic this visit was conducted virtually. This visit type was conducted due to national recommendations for restrictions regarding the COVID-19 Pandemic (e.g. social distancing, sheltering in place) in an effort to limit this patient's exposure and mitigate transmission in our community. All issues noted in this document were discussed and addressed.  A physical exam was not performed with this format.  Attempted to call patient. VM left, no return call. Will close chart.   Evelina Dun, FNP     Virtual Visit via telephone Note Due to COVID-19 pandemic this visit was conducted virtually. This visit type was conducted due to national recommendations for restrictions regarding the COVID-19 Pandemic (e.g. social distancing, sheltering in place) in an effort to limit this patient's exposure and mitigate transmission in our community. All issues noted in this document were discussed and addressed.  A physical exam was not performed with this format.  I connected with Maria West on 02/04/20 at 2:40 pm  by telephone and verified that I am speaking with the correct person using two identifiers. Maria West is currently located at work and no one is currently with her during visit. The provider, Evelina Dun, FNP is located in their office at time of visit.  I discussed the limitations, risks, security and privacy concerns of performing an evaluation and management service by telephone and the availability of in person appointments. I also discussed with the patient that there may be a patient responsible charge related to this service. The patient expressed understanding and agreed to proceed.   History and Present Illness:  Pt presents to the office today for chronic follow up. She is followed by GI for elevated liver enzymes.  Hypertension This is a chronic problem. The current episode started more than 1 year ago. The problem has  been waxing and waning since onset. The problem is uncontrolled. Associated symptoms include anxiety and palpitations. Pertinent negatives include no malaise/fatigue, peripheral edema or shortness of breath. Risk factors for coronary artery disease include dyslipidemia, obesity, sedentary lifestyle and smoking/tobacco exposure. The current treatment provides mild improvement. There is no history of CVA or heart failure.  Anxiety Presents for follow-up visit. Symptoms include decreased concentration, depressed mood, irritability, nervous/anxious behavior, palpitations and restlessness. Patient reports no shortness of breath. Symptoms occur occasionally. The severity of symptoms is moderate. The quality of sleep is good.    Depression        This is a chronic problem.  The current episode started more than 1 year ago.   The onset quality is gradual.   The problem occurs intermittently.  Associated symptoms include decreased concentration, irritable and restlessness.  Associated symptoms include no helplessness, no hopelessness and not sad.  Past medical history includes anxiety.   Hyperlipidemia This is a chronic problem. The current episode started more than 1 year ago. The problem is uncontrolled. Recent lipid tests were reviewed and are high. Pertinent negatives include no shortness of breath. Current antihyperlipidemic treatment includes statins. The current treatment provides moderate improvement of lipids. Risk factors for coronary artery disease include dyslipidemia, hypertension, a sedentary lifestyle and post-menopausal.  Nicotine Dependence Presents for follow-up visit. Symptoms include decreased concentration and irritability. Her urge triggers include company of smokers. The symptoms have been stable. She smokes 1 pack of cigarettes per day. Compliance with prior treatments has been good.      Review of Systems  Constitutional: Positive for irritability. Negative for malaise/fatigue.    Respiratory: Negative  for shortness of breath.   Cardiovascular: Positive for palpitations.  Psychiatric/Behavioral: Positive for decreased concentration and depression. The patient is nervous/anxious.      Observations/Objective: No SOB or distress noted   Assessment and Plan: Maria West comes in today with chief complaint of No chief complaint on file.   Diagnosis and orders addressed:  1. Essential hypertension - CBC with Differential/Platelet; Future - BMP8+EGFR; Future - Lipid panel; Future  2. Current smoker - CBC with Differential/Platelet; Future - BMP8+EGFR; Future - Lipid panel; Future  3. Moderate episode of recurrent major depressive disorder (HCC) - CBC with Differential/Platelet; Future - BMP8+EGFR; Future - Lipid panel; Future  4. GAD (generalized anxiety disorder) - CBC with Differential/Platelet; Future - BMP8+EGFR; Future - Lipid panel; Future  5. Hyperlipidemia, unspecified hyperlipidemia type - CBC with Differential/Platelet; Future - BMP8+EGFR; Future - Lipid panel; Future   Labs pending Health Maintenance reviewed Diet and exercise encouraged  Follow up plan: 6 months and keep follow up with GI    I discussed the assessment and treatment plan with the patient. The patient was provided an opportunity to ask questions and all were answered. The patient agreed with the plan and demonstrated an understanding of the instructions.   The patient was advised to call back or seek an in-person evaluation if the symptoms worsen or if the condition fails to improve as anticipated.  The above assessment and management plan was discussed with the patient. The patient verbalized understanding of and has agreed to the management plan. Patient is aware to call the clinic if symptoms persist or worsen. Patient is aware when to return to the clinic for a follow-up visit. Patient educated on when it is appropriate to go to the emergency department.    Time call ended:  2:52 pm   I provided 12 minutes of non-face-to-face time during this encounter.    Evelina Dun, FNP '

## 2020-02-04 NOTE — Addendum Note (Signed)
Addended by: Jannifer Rodney A on: 02/04/2020 02:51 PM   Modules accepted: Orders, Level of Service

## 2020-02-16 ENCOUNTER — Other Ambulatory Visit: Payer: Self-pay

## 2020-02-16 ENCOUNTER — Other Ambulatory Visit: Payer: BC Managed Care – PPO

## 2020-02-16 DIAGNOSIS — F331 Major depressive disorder, recurrent, moderate: Secondary | ICD-10-CM

## 2020-02-16 DIAGNOSIS — F17200 Nicotine dependence, unspecified, uncomplicated: Secondary | ICD-10-CM

## 2020-02-16 DIAGNOSIS — I10 Essential (primary) hypertension: Secondary | ICD-10-CM

## 2020-02-16 DIAGNOSIS — F411 Generalized anxiety disorder: Secondary | ICD-10-CM | POA: Diagnosis not present

## 2020-02-16 DIAGNOSIS — E785 Hyperlipidemia, unspecified: Secondary | ICD-10-CM | POA: Diagnosis not present

## 2020-02-16 DIAGNOSIS — R945 Abnormal results of liver function studies: Secondary | ICD-10-CM | POA: Diagnosis not present

## 2020-02-16 LAB — CBC WITH DIFFERENTIAL/PLATELET
Basophils Absolute: 0 10*3/uL (ref 0.0–0.2)
Basos: 1 %
EOS (ABSOLUTE): 0.1 10*3/uL (ref 0.0–0.4)
Eos: 3 %
Hematocrit: 43.1 % (ref 34.0–46.6)
Hemoglobin: 14.4 g/dL (ref 11.1–15.9)
Immature Grans (Abs): 0 10*3/uL (ref 0.0–0.1)
Immature Granulocytes: 0 %
Lymphocytes Absolute: 1.6 10*3/uL (ref 0.7–3.1)
Lymphs: 38 %
MCH: 32.4 pg (ref 26.6–33.0)
MCHC: 33.4 g/dL (ref 31.5–35.7)
MCV: 97 fL (ref 79–97)
Monocytes Absolute: 0.5 10*3/uL (ref 0.1–0.9)
Monocytes: 11 %
Neutrophils Absolute: 2 10*3/uL (ref 1.4–7.0)
Neutrophils: 47 %
Platelets: 198 10*3/uL (ref 150–450)
RBC: 4.44 x10E6/uL (ref 3.77–5.28)
RDW: 12.7 % (ref 11.7–15.4)
WBC: 4.3 10*3/uL (ref 3.4–10.8)

## 2020-02-16 LAB — LIPID PANEL
Chol/HDL Ratio: 3.6 ratio (ref 0.0–4.4)
Cholesterol, Total: 243 mg/dL — ABNORMAL HIGH (ref 100–199)
HDL: 68 mg/dL (ref >39)
LDL Chol Calc (NIH): 160 mg/dL — ABNORMAL HIGH (ref 0–99)
Triglycerides: 86 mg/dL (ref 0–149)
VLDL Cholesterol Cal: 15 mg/dL (ref 5–40)

## 2020-02-16 LAB — BMP8+EGFR
BUN/Creatinine Ratio: 22 (ref 9–23)
BUN: 15 mg/dL (ref 6–24)
CO2: 20 mmol/L (ref 20–29)
Calcium: 9.6 mg/dL (ref 8.7–10.2)
Chloride: 103 mmol/L (ref 96–106)
Creatinine, Ser: 0.67 mg/dL (ref 0.57–1.00)
GFR calc Af Amer: 117 mL/min/{1.73_m2} (ref >59)
GFR calc non Af Amer: 101 mL/min/{1.73_m2} (ref >59)
Glucose: 89 mg/dL (ref 65–99)
Potassium: 4.1 mmol/L (ref 3.5–5.2)
Sodium: 141 mmol/L (ref 134–144)

## 2020-02-17 LAB — FERRITIN: Ferritin: 187 ng/mL — ABNORMAL HIGH (ref 15–150)

## 2020-02-17 LAB — HEPATIC FUNCTION PANEL
ALT: 19 IU/L (ref 0–32)
AST: 19 IU/L (ref 0–40)
Albumin: 4.7 g/dL (ref 3.8–4.9)
Alkaline Phosphatase: 81 IU/L (ref 44–121)
Bilirubin Total: 0.5 mg/dL (ref 0.0–1.2)
Bilirubin, Direct: 0.14 mg/dL (ref 0.00–0.40)
Total Protein: 7 g/dL (ref 6.0–8.5)

## 2020-02-17 LAB — HEPATITIS B SURFACE ANTIBODY,QUALITATIVE: Hep B Surface Ab, Qual: NONREACTIVE

## 2020-02-17 LAB — HEPATITIS C ANTIBODY: Hep C Virus Ab: <0.1 s/co ratio (ref 0.0–0.9)

## 2020-02-17 LAB — HEPATITIS B SURFACE ANTIGEN: Hepatitis B Surface Ag: NEGATIVE

## 2020-02-17 LAB — HEPATITIS B CORE ANTIBODY, TOTAL: Hep B Core Total Ab: NEGATIVE

## 2020-02-18 ENCOUNTER — Other Ambulatory Visit: Payer: Self-pay

## 2020-02-18 DIAGNOSIS — R945 Abnormal results of liver function studies: Secondary | ICD-10-CM

## 2020-02-18 DIAGNOSIS — R7989 Other specified abnormal findings of blood chemistry: Secondary | ICD-10-CM

## 2020-02-21 ENCOUNTER — Other Ambulatory Visit: Payer: Self-pay | Admitting: Family

## 2020-02-21 DIAGNOSIS — E559 Vitamin D deficiency, unspecified: Secondary | ICD-10-CM

## 2020-02-24 ENCOUNTER — Other Ambulatory Visit: Payer: Self-pay | Admitting: Family

## 2020-02-24 DIAGNOSIS — E559 Vitamin D deficiency, unspecified: Secondary | ICD-10-CM

## 2020-03-01 ENCOUNTER — Other Ambulatory Visit: Payer: Self-pay | Admitting: Family

## 2020-03-01 DIAGNOSIS — E785 Hyperlipidemia, unspecified: Secondary | ICD-10-CM

## 2020-07-01 ENCOUNTER — Other Ambulatory Visit: Payer: Self-pay | Admitting: Family

## 2020-07-01 DIAGNOSIS — I10 Essential (primary) hypertension: Secondary | ICD-10-CM

## 2020-07-10 ENCOUNTER — Other Ambulatory Visit: Payer: Self-pay | Admitting: Family

## 2020-08-03 ENCOUNTER — Other Ambulatory Visit: Payer: Self-pay

## 2020-08-03 DIAGNOSIS — R7989 Other specified abnormal findings of blood chemistry: Secondary | ICD-10-CM

## 2020-08-03 DIAGNOSIS — R945 Abnormal results of liver function studies: Secondary | ICD-10-CM

## 2020-08-11 ENCOUNTER — Other Ambulatory Visit: Payer: Self-pay | Admitting: Family

## 2020-08-11 DIAGNOSIS — I10 Essential (primary) hypertension: Secondary | ICD-10-CM

## 2020-08-29 ENCOUNTER — Other Ambulatory Visit: Payer: Self-pay | Admitting: Family

## 2020-08-29 DIAGNOSIS — E785 Hyperlipidemia, unspecified: Secondary | ICD-10-CM

## 2020-08-30 ENCOUNTER — Ambulatory Visit: Payer: BC Managed Care – PPO | Admitting: Family

## 2020-09-01 ENCOUNTER — Encounter: Payer: Self-pay | Admitting: Family

## 2020-09-01 ENCOUNTER — Other Ambulatory Visit: Payer: Self-pay

## 2020-09-01 ENCOUNTER — Ambulatory Visit: Payer: 59 | Admitting: Family

## 2020-09-01 VITALS — BP 124/72 | HR 66 | Temp 97.3°F | Wt 162.8 lb

## 2020-09-01 DIAGNOSIS — F17200 Nicotine dependence, unspecified, uncomplicated: Secondary | ICD-10-CM

## 2020-09-01 DIAGNOSIS — R748 Abnormal levels of other serum enzymes: Secondary | ICD-10-CM

## 2020-09-01 DIAGNOSIS — I10 Essential (primary) hypertension: Secondary | ICD-10-CM

## 2020-09-01 DIAGNOSIS — F331 Major depressive disorder, recurrent, moderate: Secondary | ICD-10-CM | POA: Diagnosis not present

## 2020-09-01 DIAGNOSIS — F411 Generalized anxiety disorder: Secondary | ICD-10-CM

## 2020-09-01 DIAGNOSIS — Z23 Encounter for immunization: Secondary | ICD-10-CM | POA: Diagnosis not present

## 2020-09-01 DIAGNOSIS — E559 Vitamin D deficiency, unspecified: Secondary | ICD-10-CM | POA: Diagnosis not present

## 2020-09-01 NOTE — Patient Instructions (Signed)
Health Maintenance, Female Adopting a healthy lifestyle and getting preventive care are important in promoting health and wellness. Ask your health care provider about:  The right schedule for you to have regular tests and exams.  Things you can do on your own to prevent diseases and keep yourself healthy. What should I know about diet, weight, and exercise? Eat a healthy diet  Eat a diet that includes plenty of vegetables, fruits, low-fat dairy products, and lean protein.  Do not eat a lot of foods that are high in solid fats, added sugars, or sodium.   Maintain a healthy weight Body mass index (BMI) is used to identify weight problems. It estimates body fat based on height and weight. Your health care provider can help determine your BMI and help you achieve or maintain a healthy weight. Get regular exercise Get regular exercise. This is one of the most important things you can do for your health. Most adults should:  Exercise for at least 150 minutes each week. The exercise should increase your heart rate and make you sweat (moderate-intensity exercise).  Do strengthening exercises at least twice a week. This is in addition to the moderate-intensity exercise.  Spend less time sitting. Even light physical activity can be beneficial. Watch cholesterol and blood lipids Have your blood tested for lipids and cholesterol at 53 years of age, then have this test every 5 years. Have your cholesterol levels checked more often if:  Your lipid or cholesterol levels are high.  You are older than 53 years of age.  You are at high risk for heart disease. What should I know about cancer screening? Depending on your health history and family history, you may need to have cancer screening at various ages. This may include screening for:  Breast cancer.  Cervical cancer.  Colorectal cancer.  Skin cancer.  Lung cancer. What should I know about heart disease, diabetes, and high blood  pressure? Blood pressure and heart disease  High blood pressure causes heart disease and increases the risk of stroke. This is more likely to develop in people who have high blood pressure readings, are of African descent, or are overweight.  Have your blood pressure checked: ? Every 3-5 years if you are 18-39 years of age. ? Every year if you are 40 years old or older. Diabetes Have regular diabetes screenings. This checks your fasting blood sugar level. Have the screening done:  Once every three years after age 40 if you are at a normal weight and have a low risk for diabetes.  More often and at a younger age if you are overweight or have a high risk for diabetes. What should I know about preventing infection? Hepatitis B If you have a higher risk for hepatitis B, you should be screened for this virus. Talk with your health care provider to find out if you are at risk for hepatitis B infection. Hepatitis C Testing is recommended for:  Everyone born from 1945 through 1965.  Anyone with known risk factors for hepatitis C. Sexually transmitted infections (STIs)  Get screened for STIs, including gonorrhea and chlamydia, if: ? You are sexually active and are younger than 53 years of age. ? You are older than 53 years of age and your health care provider tells you that you are at risk for this type of infection. ? Your sexual activity has changed since you were last screened, and you are at increased risk for chlamydia or gonorrhea. Ask your health care provider   if you are at risk.  Ask your health care provider about whether you are at high risk for HIV. Your health care provider may recommend a prescription medicine to help prevent HIV infection. If you choose to take medicine to prevent HIV, you should first get tested for HIV. You should then be tested every 3 months for as long as you are taking the medicine. Pregnancy  If you are about to stop having your period (premenopausal) and  you may become pregnant, seek counseling before you get pregnant.  Take 400 to 800 micrograms (mcg) of folic acid every day if you become pregnant.  Ask for birth control (contraception) if you want to prevent pregnancy. Osteoporosis and menopause Osteoporosis is a disease in which the bones lose minerals and strength with aging. This can result in bone fractures. If you are 65 years old or older, or if you are at risk for osteoporosis and fractures, ask your health care provider if you should:  Be screened for bone loss.  Take a calcium or vitamin D supplement to lower your risk of fractures.  Be given hormone replacement therapy (HRT) to treat symptoms of menopause. Follow these instructions at home: Lifestyle  Do not use any products that contain nicotine or tobacco, such as cigarettes, e-cigarettes, and chewing tobacco. If you need help quitting, ask your health care provider.  Do not use street drugs.  Do not share needles.  Ask your health care provider for help if you need support or information about quitting drugs. Alcohol use  Do not drink alcohol if: ? Your health care provider tells you not to drink. ? You are pregnant, may be pregnant, or are planning to become pregnant.  If you drink alcohol: ? Limit how much you use to 0-1 drink a day. ? Limit intake if you are breastfeeding.  Be aware of how much alcohol is in your drink. In the [place] one drink equals one 12 oz bottle of beer (355 mL), one 5 oz glass of wine (148 mL), or one 1 oz glass of hard liquor (44 mL). General instructions  Schedule regular health, dental, and eye exams.  Stay current with your vaccines.  Tell your health care provider if: ? You often feel depressed. ? You have ever been abused or do not feel safe at home. Summary  Adopting a healthy lifestyle and getting preventive care are important in promoting health and wellness.  Follow your health care provider's instructions about healthy  diet, exercising, and getting tested or screened for diseases.  Follow your health care provider's instructions on monitoring your cholesterol and blood pressure. This information is not intended to replace advice given to you by your health care provider. Make sure you discuss any questions you have with your health care provider. Document Revised: 04/02/2018 Document Reviewed: 04/02/2018 Elsevier Patient Education  2021 [employer]  

## 2020-09-01 NOTE — Progress Notes (Signed)
Subjective:    Patient ID: Maria West, female    DOB: 21-May-1965, 55 y.o.   MRN: [id number]  Chief Complaint  Patient presents with  . Medical Management of Chronic Issues  . Hypertension   Pt presents to the office today for chronic follow up.She is followed by GI for elevated liver enzymes.  Hypertension This is a chronic problem. The current episode started more than 1 year ago. The problem has been resolved since onset. The problem is controlled. Associated symptoms include anxiety. Pertinent negatives include no malaise/fatigue, peripheral edema or shortness of breath. Risk factors for coronary artery disease include dyslipidemia and sedentary lifestyle. The current treatment provides moderate improvement.  Hyperlipidemia This is a chronic problem. The current episode started more than 1 year ago. The problem is uncontrolled. Recent lipid tests were reviewed and are high. Pertinent negatives include no shortness of breath. Current antihyperlipidemic treatment includes statins. The current treatment provides moderate improvement of lipids. Risk factors for coronary artery disease include dyslipidemia, hypertension and a sedentary lifestyle.  Anxiety Presents for follow-up visit. Symptoms include depressed mood, excessive worry, irritability, nervous/anxious behavior and restlessness. Patient reports no shortness of breath. Symptoms occur occasionally. The severity of symptoms is moderate.    Depression        This is a chronic problem.  The current episode started more than 1 year ago.   Associated symptoms include irritable, restlessness and sad.  Associated symptoms include no helplessness and no hopelessness.  Past medical history includes anxiety.   Nicotine Dependence Presents for follow-up visit. Symptoms include irritability. Her urge triggers include company of smokers. The symptoms have been stable. She smokes 1 pack of cigarettes per day.      Review of Systems   Constitutional: Positive for irritability. Negative for malaise/fatigue.  Respiratory: Negative for shortness of breath.   Psychiatric/Behavioral: Positive for depression. The patient is nervous/anxious.   All other systems reviewed and are negative.      Objective:   Physical Exam Vitals reviewed.  Constitutional:      General: She is irritable. She is not in acute distress.    Appearance: She is well-developed.  HENT:     Head: Normocephalic and atraumatic.     Right Ear: Tympanic membrane normal.     Left Ear: Tympanic membrane normal.  Eyes:     Pupils: Pupils are equal, round, and reactive to light.  Neck:     Thyroid: No thyromegaly.  Cardiovascular:     Rate and Rhythm: Normal rate and regular rhythm.     Heart sounds: Normal heart sounds. No murmur heard.   Pulmonary:     Effort: Pulmonary effort is normal. No respiratory distress.     Breath sounds: Normal breath sounds. No wheezing.  Abdominal:     General: Bowel sounds are normal. There is no distension.     Palpations: Abdomen is soft.     Tenderness: There is no abdominal tenderness.  Musculoskeletal:        General: No tenderness. Normal range of motion.     Cervical back: Normal range of motion and neck supple.  Skin:    General: Skin is warm and dry.  Neurological:     Mental Status: She is alert and oriented to person, place, and time.     Cranial Nerves: No cranial nerve deficit.     Deep Tendon Reflexes: Reflexes are normal and symmetric.  Psychiatric:        Behavior: Behavior normal.  Thought Content: Thought content normal.        Judgment: Judgment normal.       BP 124/72   Pulse 66   Temp (!) 97.3 F (36.3 C) (Temporal)   Wt 162 lb 12.8 oz (73.8 kg)   LMP 08/18/2009   BMI 28.84 kg/m      Assessment & Plan:  Maria West comes in today with chief complaint of Medical Management of Chronic Issues and Hypertension   Diagnosis and orders addressed:  1. Essential  hypertension - BMP8+EGFR  2. Vitamin D deficiency - BMP8+EGFR - VITAMIN D 25 Hydroxy (Vit-D Deficiency, Fractures)  3. GAD (generalized anxiety disorder) - BMP8+EGFR  4. Moderate episode of recurrent major depressive disorder (HCC) - BMP8+EGFR  5. Current smoker - BMP8+EGFR   6. Elevated liver enzymes  Labs pending Health Maintenance reviewed Diet and exercise encouraged  Follow up plan: 6 months and keep follow up with GI   Evelina Dun, FNP

## 2020-09-02 ENCOUNTER — Other Ambulatory Visit: Payer: Self-pay | Admitting: Family

## 2020-09-02 DIAGNOSIS — E559 Vitamin D deficiency, unspecified: Secondary | ICD-10-CM

## 2020-09-02 LAB — VITAMIN D 25 HYDROXY (VIT D DEFICIENCY, FRACTURES): Vit D, 25-Hydroxy: 28.8 ng/mL — ABNORMAL LOW (ref 30.0–100.0)

## 2020-09-02 LAB — HEPATIC FUNCTION PANEL
ALT: 29 IU/L (ref 0–32)
AST: 25 IU/L (ref 0–40)
Albumin: 4.4 g/dL (ref 3.8–4.9)
Alkaline Phosphatase: 79 IU/L (ref 44–121)
Bilirubin Total: 0.2 mg/dL (ref 0.0–1.2)
Bilirubin, Direct: <0.1 mg/dL (ref 0.00–0.40)
Total Protein: 6.8 g/dL (ref 6.0–8.5)

## 2020-09-02 LAB — BMP8+EGFR
BUN/Creatinine Ratio: 24 — ABNORMAL HIGH (ref 9–23)
BUN: 13 mg/dL (ref 6–24)
CO2: 20 mmol/L (ref 20–29)
Calcium: 9.6 mg/dL (ref 8.7–10.2)
Chloride: 106 mmol/L (ref 96–106)
Creatinine, Ser: 0.55 mg/dL — ABNORMAL LOW (ref 0.57–1.00)
Glucose: 81 mg/dL (ref 65–99)
Potassium: 4.3 mmol/L (ref 3.5–5.2)
Sodium: 142 mmol/L (ref 134–144)
eGFR: 110 mL/min/{1.73_m2} (ref >59)

## 2020-09-02 LAB — FERRITIN: Ferritin: 184 ng/mL — ABNORMAL HIGH (ref 15–150)

## 2020-09-02 MED ORDER — VITAMIN D (ERGOCALCIFEROL) 1.25 MG (50000 UNIT) PO CAPS: 1.0000 | ORAL_CAPSULE | ORAL | 2 refills | Status: DC

## 2020-09-05 ENCOUNTER — Other Ambulatory Visit: Payer: Self-pay | Admitting: Family

## 2020-09-05 DIAGNOSIS — I10 Essential (primary) hypertension: Secondary | ICD-10-CM

## 2020-10-04 ENCOUNTER — Other Ambulatory Visit: Payer: Self-pay | Admitting: Family

## 2020-10-04 DIAGNOSIS — E785 Hyperlipidemia, unspecified: Secondary | ICD-10-CM

## 2020-10-06 ENCOUNTER — Other Ambulatory Visit: Payer: Self-pay | Admitting: Gastroenterology

## 2020-10-06 ENCOUNTER — Other Ambulatory Visit: Payer: Self-pay | Admitting: *Deleted

## 2020-10-06 DIAGNOSIS — R7989 Other specified abnormal findings of blood chemistry: Secondary | ICD-10-CM

## 2021-02-23 ENCOUNTER — Other Ambulatory Visit: Payer: Self-pay | Admitting: Family

## 2021-02-23 DIAGNOSIS — F331 Major depressive disorder, recurrent, moderate: Secondary | ICD-10-CM

## 2021-02-23 DIAGNOSIS — F411 Generalized anxiety disorder: Secondary | ICD-10-CM

## 2021-04-27 ENCOUNTER — Other Ambulatory Visit: Payer: Self-pay | Admitting: Family

## 2021-04-27 DIAGNOSIS — F411 Generalized anxiety disorder: Secondary | ICD-10-CM

## 2021-04-27 DIAGNOSIS — F331 Major depressive disorder, recurrent, moderate: Secondary | ICD-10-CM

## 2021-04-27 MED ORDER — CITALOPRAM HYDROBROMIDE 40 MG PO TABS: 40.0000 mg | ORAL_TABLET | Freq: Every day | ORAL | 0 refills | Status: DC

## 2021-04-27 NOTE — Addendum Note (Signed)
Addended by: Julious Payer D on: 04/27/2021 03:30 PM   Modules accepted: Orders

## 2021-04-27 NOTE — Telephone Encounter (Signed)
[facility]. NTBS 30 days given 02/23/21

## 2021-04-27 NOTE — Telephone Encounter (Signed)
Apt scheduled 05/03/2021

## 2021-05-03 ENCOUNTER — Encounter: Payer: Self-pay | Admitting: Family

## 2021-05-03 ENCOUNTER — Ambulatory Visit (INDEPENDENT_AMBULATORY_CARE_PROVIDER_SITE_OTHER): Payer: 59 | Admitting: Family

## 2021-05-03 VITALS — BP 132/80 | HR 76 | Temp 98.1°F | Ht 63.0 in | Wt 160.0 lb

## 2021-05-03 DIAGNOSIS — I10 Essential (primary) hypertension: Secondary | ICD-10-CM

## 2021-05-03 DIAGNOSIS — F17200 Nicotine dependence, unspecified, uncomplicated: Secondary | ICD-10-CM

## 2021-05-03 DIAGNOSIS — F331 Major depressive disorder, recurrent, moderate: Secondary | ICD-10-CM | POA: Diagnosis not present

## 2021-05-03 DIAGNOSIS — E785 Hyperlipidemia, unspecified: Secondary | ICD-10-CM

## 2021-05-03 DIAGNOSIS — F411 Generalized anxiety disorder: Secondary | ICD-10-CM

## 2021-05-03 DIAGNOSIS — E559 Vitamin D deficiency, unspecified: Secondary | ICD-10-CM

## 2021-05-03 NOTE — Progress Notes (Signed)
Subjective:    Patient ID: Maria West, female    DOB: [birth year], 56 y.o.   MRN: [id number]  Chief Complaint  Patient presents with   Medical Management of Chronic Issues   Pt presents to the office today for chronic follow up. She is followed by GI for elevated liver enzymes.  Hypertension This is a chronic problem. The current episode started more than 1 year ago. The problem has been resolved since onset. The problem is controlled. Pertinent negatives include no peripheral edema or shortness of breath. Risk factors for coronary artery disease include dyslipidemia, obesity and sedentary lifestyle. The current treatment provides moderate improvement.  Hyperlipidemia This is a chronic problem. The current episode started more than 1 year ago. The problem is controlled. Exacerbating diseases include obesity. Pertinent negatives include no shortness of breath. Current antihyperlipidemic treatment includes statins. The current treatment provides moderate improvement of lipids. Risk factors for coronary artery disease include dyslipidemia, hypertension and a sedentary lifestyle.  Anxiety Presents for follow-up visit. Symptoms include excessive worry and nervous/anxious behavior. Patient reports no irritability, restlessness or shortness of breath.    Depression        This is a chronic problem.  The current episode started more than 1 year ago.   Associated symptoms include fatigue and irritable.  Associated symptoms include no helplessness, no hopelessness, no restlessness and not sad.  Past treatments include SSRIs - Selective serotonin reuptake inhibitors. Nicotine Dependence Presents for follow-up visit. Symptoms include fatigue. Symptoms are negative for irritability. Her urge triggers include company of smokers. The symptoms have been stable. She smokes 1 pack of cigarettes per day.     Review of Systems  Constitutional:  Positive for fatigue. Negative for irritability.   Respiratory:  Negative for shortness of breath.   Psychiatric/Behavioral:  The patient is nervous/anxious.   All other systems reviewed and are negative.     Objective:   Physical Exam Vitals reviewed.  Constitutional:      General: She is irritable. She is not in acute distress.    Appearance: She is well-developed.  HENT:     Head: Normocephalic and atraumatic.     Right Ear: Tympanic membrane normal.     Left Ear: Tympanic membrane normal.  Eyes:     Pupils: Pupils are equal, round, and reactive to light.  Neck:     Thyroid: No thyromegaly.  Cardiovascular:     Rate and Rhythm: Normal rate and regular rhythm.     Heart sounds: Normal heart sounds. No murmur heard. Pulmonary:     Effort: Pulmonary effort is normal. No respiratory distress.     Breath sounds: Normal breath sounds. No wheezing.  Abdominal:     General: Bowel sounds are normal. There is no distension.     Palpations: Abdomen is soft.     Tenderness: There is no abdominal tenderness.  Musculoskeletal:        General: No tenderness. Normal range of motion.     Cervical back: Normal range of motion and neck supple.  Skin:    General: Skin is warm and dry.  Neurological:     Mental Status: She is alert and oriented to person, place, and time.     Cranial Nerves: No cranial nerve deficit.     Deep Tendon Reflexes: Reflexes are normal and symmetric.  Psychiatric:        Behavior: Behavior normal.        Thought Content: Thought content normal.  Judgment: Judgment normal.      BP 132/80    Pulse 76    Temp 98.1 F (36.7 C) (Temporal)    Ht '5\' 3"'  (1.6 m)    Wt 160 lb (72.6 kg)    LMP 08/18/2009    BMI 28.34 kg/m      Assessment & Plan:  [name] comes in today with chief complaint of Medical Management of Chronic Issues   Diagnosis and orders addressed:  1. Essential hypertension - CMP14+EGFR - CBC with Differential/Platelet   3. Hyperlipidemia, unspecified hyperlipidemia type -  CMP14+EGFR - CBC with Differential/Platelet  4. Current smoker - CMP14+EGFR - CBC with Differential/Platelet  5. Moderate episode of recurrent major depressive disorder (HCC) - CMP14+EGFR - CBC with Differential/Platelet  6. GAD (generalized anxiety disorder) - CMP14+EGFR - CBC with Differential/Platelet  7. Vitamin D deficiency - CMP14+EGFR - CBC with Differential/Platelet   Labs pending Health Maintenance reviewed Diet and exercise encouraged  Follow up plan: 6 months    Evelina Dun, FNP

## 2021-05-03 NOTE — Patient Instructions (Signed)
Fatty Liver Disease  The liver converts food into energy, removes toxic material from the blood, makes important proteins, and absorbs necessary vitamins from food. Fatty liver disease occurs when too much fat has built up in your liver cells. Fatty liverdisease is also called hepatic steatosis. In many cases, fatty liver disease does not cause symptoms or problems. It is often diagnosed when tests are being done for other reasons. However, over time, fatty liver can cause inflammation that may lead to more serious liver problems, such as scarring of the liver (cirrhosis) and liver failure. Fatty liver is associated with insulin resistance, increased body fat, high blood pressure (hypertension), and high cholesterol. These are features of metabolic syndrome and increaseyour risk for stroke, diabetes, and heart disease. What are the causes? This condition may be caused by components of metabolic syndrome: Obesity. Insulin resistance. High cholesterol. Other causes: Alcohol abuse. Poor nutrition. Cushing syndrome. Pregnancy. Certain drugs. Poisons. Some viral infections. What increases the risk? You are more likely to develop this condition if you: Abuse alcohol. Are overweight. Have diabetes. Have hepatitis. Have a high triglyceride level. Are pregnant. What are the signs or symptoms? Fatty liver disease often does not cause symptoms. If symptoms do develop, they can include: Fatigue and weakness. Weight loss. Confusion. Nausea, vomiting, or abdominal pain. Yellowing of your skin and the white parts of your eyes (jaundice). Itchy skin. How is this diagnosed? This condition may be diagnosed by: A physical exam and your medical history. Blood tests. Imaging tests, such as an ultrasound, CT scan, or MRI. A liver biopsy. A small sample of liver tissue is removed using a needle. The sample is then looked at under a microscope. How is this treated? Fatty liver disease is often  caused by other health conditions. Treatment for fatty liver may involve medicines and lifestyle changes to manage conditions such as: Alcoholism. High cholesterol. Diabetes. Being overweight or obese. Follow these instructions at home:  Do not drink alcohol. If you have trouble quitting, ask your health care provider how to safely quit with the help of medicine or a supervised program. This is important to keep your condition from getting worse. Eat a healthy diet as told by your health care provider. Ask your health care provider about working with a dietitian to develop an eating plan. Exercise regularly. This can help you lose weight and control your cholesterol and diabetes. Talk to your health care provider about an exercise plan and which activities are best for you. Take over-the-counter and prescription medicines only as told by your health care provider. Keep all follow-up visits. This is important. Contact a health care provider if: You have trouble controlling your: Blood sugar. This is especially important if you have diabetes. Cholesterol. Drinking of alcohol. Get help right away if: You have abdominal pain. You have jaundice. You have nausea and are vomiting. You vomit blood or material that looks like coffee grounds. You have stools that are black, tar-like, or bloody. Summary Fatty liver disease develops when too much fat builds up in the cells of your liver. Fatty liver disease often causes no symptoms or problems. However, over time, fatty liver can cause inflammation that may lead to more serious liver problems, such as scarring of the liver (cirrhosis). You are more likely to develop this condition if you abuse alcohol, are pregnant, are overweight, have diabetes, have hepatitis, or have high triglyceride or cholesterol levels. Contact your health care provider if you have trouble controlling your blood sugar, cholesterol,   or drinking of alcohol. This information is  not intended to replace advice given to you by your health care provider. Make sure you discuss any questions you have with your healthcare provider. Document Revised: 01/21/2020 Document Reviewed: 01/21/2020 Elsevier Patient Education  2022 [employer]  

## 2021-05-04 LAB — CBC WITH DIFFERENTIAL/PLATELET
Basophils Absolute: 0 10*3/uL (ref 0.0–0.2)
Basos: 1 %
EOS (ABSOLUTE): 0.1 10*3/uL (ref 0.0–0.4)
Eos: 1 %
Hematocrit: 40.6 % (ref 34.0–46.6)
Hemoglobin: 14.4 g/dL (ref 11.1–15.9)
Immature Grans (Abs): 0 10*3/uL (ref 0.0–0.1)
Immature Granulocytes: 0 %
Lymphocytes Absolute: 2.5 10*3/uL (ref 0.7–3.1)
Lymphs: 36 %
MCH: 33.5 pg — ABNORMAL HIGH (ref 26.6–33.0)
MCHC: 35.5 g/dL (ref 31.5–35.7)
MCV: 94 fL (ref 79–97)
Monocytes Absolute: 0.7 10*3/uL (ref 0.1–0.9)
Monocytes: 11 %
Neutrophils Absolute: 3.6 10*3/uL (ref 1.4–7.0)
Neutrophils: 51 %
Platelets: 190 10*3/uL (ref 150–450)
RBC: 4.3 x10E6/uL (ref 3.77–5.28)
RDW: 12.7 % (ref 11.7–15.4)
WBC: 6.9 10*3/uL (ref 3.4–10.8)

## 2021-05-04 LAB — CMP14+EGFR
ALT: 37 IU/L — ABNORMAL HIGH (ref 0–32)
AST: 37 IU/L (ref 0–40)
Albumin/Globulin Ratio: 2 (ref 1.2–2.2)
Albumin: 4.7 g/dL (ref 3.8–4.9)
Alkaline Phosphatase: 79 IU/L (ref 44–121)
BUN/Creatinine Ratio: 17 (ref 9–23)
BUN: 15 mg/dL (ref 6–24)
Bilirubin Total: 0.3 mg/dL (ref 0.0–1.2)
CO2: 22 mmol/L (ref 20–29)
Calcium: 9.8 mg/dL (ref 8.7–10.2)
Chloride: 101 mmol/L (ref 96–106)
Creatinine, Ser: 0.89 mg/dL (ref 0.57–1.00)
Globulin, Total: 2.3 g/dL (ref 1.5–4.5)
Glucose: 99 mg/dL (ref 70–99)
Potassium: 4.1 mmol/L (ref 3.5–5.2)
Sodium: 138 mmol/L (ref 134–144)
Total Protein: 7 g/dL (ref 6.0–8.5)
eGFR: 77 mL/min/{1.73_m2} (ref >59)

## 2021-05-11 ENCOUNTER — Other Ambulatory Visit: Payer: Self-pay | Admitting: Family

## 2021-05-11 DIAGNOSIS — E559 Vitamin D deficiency, unspecified: Secondary | ICD-10-CM

## 2021-05-24 ENCOUNTER — Other Ambulatory Visit: Payer: Self-pay | Admitting: Family

## 2021-05-24 DIAGNOSIS — I10 Essential (primary) hypertension: Secondary | ICD-10-CM

## 2021-06-11 ENCOUNTER — Other Ambulatory Visit: Payer: Self-pay | Admitting: Family

## 2021-06-11 DIAGNOSIS — E785 Hyperlipidemia, unspecified: Secondary | ICD-10-CM

## 2021-06-12 ENCOUNTER — Other Ambulatory Visit: Payer: Self-pay | Admitting: Family

## 2021-06-12 DIAGNOSIS — F411 Generalized anxiety disorder: Secondary | ICD-10-CM

## 2021-06-25 ENCOUNTER — Other Ambulatory Visit: Payer: Self-pay | Admitting: Family

## 2021-06-25 DIAGNOSIS — F331 Major depressive disorder, recurrent, moderate: Secondary | ICD-10-CM

## 2021-06-25 DIAGNOSIS — F411 Generalized anxiety disorder: Secondary | ICD-10-CM

## 2021-07-31 ENCOUNTER — Other Ambulatory Visit: Payer: Self-pay | Admitting: Family

## 2021-07-31 DIAGNOSIS — E559 Vitamin D deficiency, unspecified: Secondary | ICD-10-CM

## 2021-07-31 NOTE — Telephone Encounter (Signed)
Last OV 05/03/2021. Last RF 05/12/21 #12 no refills. Next OV none scheduled ?Vit D 28.8 on 09/01/2020 ? ? ?

## 2021-08-17 ENCOUNTER — Ambulatory Visit: Payer: 59 | Admitting: Podiatrist

## 2021-08-17 ENCOUNTER — Encounter: Payer: Self-pay | Admitting: Podiatrist

## 2021-08-17 ENCOUNTER — Ambulatory Visit (INDEPENDENT_AMBULATORY_CARE_PROVIDER_SITE_OTHER): Payer: 59

## 2021-08-17 DIAGNOSIS — M79672 Pain in left foot: Secondary | ICD-10-CM | POA: Diagnosis not present

## 2021-08-17 DIAGNOSIS — M79671 Pain in right foot: Secondary | ICD-10-CM

## 2021-08-17 DIAGNOSIS — M722 Plantar fascial fibromatosis: Secondary | ICD-10-CM

## 2021-08-17 MED ORDER — TRIAMCINOLONE ACETONIDE 10 MG/ML IJ SUSP
10.0000 mg | Freq: Once | INTRAMUSCULAR | Status: AC
  Administered 2021-08-17: 10 mg

## 2021-08-17 NOTE — Progress Notes (Signed)
?  ? ?HPI: Patient is 56 y.o. female who presents today for pain in bilateral heels which started about 6 months ago she states she walks a lot the pain is sharp and sore in nature she rates the pain a 10 out of 10.  She relates that she has always walked on concrete floors for work.  She states that he has pain at the end of the day in both heels.  She is tried no treatment besides over-the-counter inserts. ? ?Patient Active Problem List  ? Diagnosis Date Noted  ? Polypharmacy 03/16/2019  ? Dyshidrotic eczema 02/12/2019  ? GAD (generalized anxiety disorder) 08/22/2015  ? Current smoker 02/22/2015  ? Hyperlipidemia 02/07/2015  ? Vitamin D deficiency 02/07/2015  ? Essential hypertension 02/07/2015  ? Cervical radiculopathy 09/30/2012  ? Depression 08/18/2012  ? ? ?Current Outpatient Medications on File Prior to Visit  ?Medication Sig Dispense Refill  ? atorvastatin (LIPITOR) 40 MG tablet Take 1 tablet by mouth once daily 90 tablet 0  ? busPIRone (BUSPAR) 7.5 MG tablet Take 1 tablet by mouth twice daily 60 tablet 2  ? citalopram (CELEXA) 40 MG tablet Take 1 tablet by mouth once daily 30 tablet 0  ? clobetasol cream (TEMOVATE) 0.05 % Apply 1 application topically 2 (two) times daily as needed (ezcema on feet). 60 g 2  ? losartan (COZAAR) 50 MG tablet Take 1 tablet by mouth once daily 90 tablet 0  ? Vitamin D, Ergocalciferol, (DRISDOL) 1.25 MG (50000 UNIT) CAPS capsule Take 1 capsule by mouth once a week 12 capsule 0  ? ?No current facility-administered medications on file prior to visit.  ? ? ?Allergies  ?Allergen Reactions  ? Penicillins Itching  ?  Did it involve swelling of the face/tongue/throat, SOB, or low BP? No ?Did it involve sudden or severe rash/hives, skin peeling, or any reaction on the inside of your mouth or nose? No ?Did you need to seek medical attention at a hospital or doctor's office? No ?When did it last happen? About 3 years ago   ?If all above answers are ?NO?, may proceed with cephalosporin  use. ?  ? ? ?Review of Systems ?No fevers, chills, nausea, muscle aches, no difficulty breathing, no calf pain, no chest pain or shortness of breath. ? ? ?Physical Exam ? ?GENERAL APPEARANCE: Alert, conversant. Appropriately groomed. No acute distress.  ? ?VASCULAR: Pedal pulses palpable DP and PT bilateral.  Capillary refill time is immediate to all digits,  Proximal to distal cooling it warm to warm.  Digital perfusion adequate.  ? ?NEUROLOGIC: sensation is intact to 5.07 monofilament at 5/5 sites bilateral.  Light touch is intact bilateral, vibratory sensation intact bilateral ? ?MUSCULOSKELETAL: acceptable muscle strength, tone and stability bilateral.  Rectus foot type is noted bilateral.  Pain on palpation plantar medial and plantar central aspect of bilateral heels at the insertion of the plantar fascia.  Pain with direct pressure on the plantar aspect of the heel is also noted. ? ?DERMATOLOGIC: skin is warm, supple, and dry.  Color, texture, and turgor of skin within normal limits.  No open wounds are noted.  No preulcerative lesions are seen.  Digital nails are asymptomatic.   ? ?X-ray evaluation: 3 views of bilateral feet are obtained.No sign of fracture or dislocation is noted.  No acute osseous abnormalities are noted.  No inferior calcaneal spurring is noted. ? ?Assessment  ? ?  ICD-10-CM   ?1. Bilateral foot pain  M79.671 DG Foot Complete Left  ? M79.672  DG Foot Complete Right  ?  ?2. Plantar fasciitis, bilateral  M72.2   ?  ? ? ? ?Plan ? ?Discussed exam and x-ray findings with the patient.  Discussed the positive long-term benefits of injection therapy as well as a change in shoe gear.  I performed a steroid injection with Kenalog and Marcaine mixture today under sterile technique without complication she did very well.  Also dispensed power step inserts and recommended stretching exercises.  She will be seen back in 6 weeks for follow-up and will call sooner if any questions concerns or problems  arise. ?

## 2021-08-17 NOTE — Patient Instructions (Signed)

## 2021-08-25 ENCOUNTER — Other Ambulatory Visit: Payer: Self-pay | Admitting: Family

## 2021-08-25 DIAGNOSIS — I10 Essential (primary) hypertension: Secondary | ICD-10-CM

## 2021-08-25 DIAGNOSIS — F411 Generalized anxiety disorder: Secondary | ICD-10-CM

## 2021-08-25 DIAGNOSIS — F331 Major depressive disorder, recurrent, moderate: Secondary | ICD-10-CM

## 2021-09-08 ENCOUNTER — Other Ambulatory Visit: Payer: Self-pay | Admitting: Family

## 2021-09-08 DIAGNOSIS — E785 Hyperlipidemia, unspecified: Secondary | ICD-10-CM

## 2021-09-28 ENCOUNTER — Ambulatory Visit: Payer: 59 | Admitting: Podiatry

## 2021-09-28 DIAGNOSIS — M722 Plantar fascial fibromatosis: Secondary | ICD-10-CM | POA: Diagnosis not present

## 2021-09-28 MED ORDER — TRIAMCINOLONE ACETONIDE 10 MG/ML IJ SUSP
10.0000 mg | Freq: Once | INTRAMUSCULAR | Status: AC
  Administered 2021-09-28: 10 mg

## 2021-09-28 NOTE — Patient Instructions (Signed)

## 2021-10-01 NOTE — Progress Notes (Signed)
Subjective: 56 year old female presents the office today for follow evaluation of bilateral heel pain which started at 6 months prior to coming into the appointment.  She was last seen by Dr. Irving Shows and she had steroid injections which were helpful.  She states the pain is starting to come back and she is requesting the round of injections.  She still been stretching, icing and wearing more supportive shoes.   Objective: AAO x3, NAD DP/PT pulses palpable bilaterally, CRT less than 3 seconds Tenderness to palpation along the plantar medial tubercle of the calcaneus at the insertion of plantar fascia on the left and right foot. There is no pain along the course of the plantar fascia within the arch of the foot. Plantar fascia appears to be intact. There is no pain with lateral compression of the calcaneus or pain with vibratory sensation. There is no pain along the course or insertion of the achilles tendon. No other areas of tenderness to bilateral lower extremities.  Negative sign. No pain with calf compression, swelling, warmth, erythema  Assessment: Bilateral heel pain, Plantar fasciitis  Plan: -All treatment options discussed with the patient including all alternatives, risks, complications.  -Repeat injections performed today.  See procedure note below.  Continue stretching, icing on a regular basis as well as wearing shoes and good arch support. -Patient encouraged to call the office with any questions, concerns, change in symptoms.   Procedure: Injection Tendon/Ligament Discussed alternatives, risks, complications and verbal consent was obtained.  Location: Bilateral plantar fascia at the glabrous junction; medial approach. Skin Prep: Alcohol. Injectate: 0.5cc 0.5% marcaine plain, 0.5 cc 2% lidocaine plain and, 1 cc kenalog 10. Disposition: Patient tolerated procedure well. Injection site dressed with a band-aid.  Post-injection care was discussed and return precautions discussed.    Vivi Barrack DPM

## 2021-10-21 ENCOUNTER — Other Ambulatory Visit: Payer: Self-pay | Admitting: Nurse Practitioner

## 2021-10-21 DIAGNOSIS — E559 Vitamin D deficiency, unspecified: Secondary | ICD-10-CM

## 2021-10-30 ENCOUNTER — Other Ambulatory Visit: Payer: Self-pay | Admitting: Family

## 2021-10-30 DIAGNOSIS — F331 Major depressive disorder, recurrent, moderate: Secondary | ICD-10-CM

## 2021-10-30 DIAGNOSIS — F411 Generalized anxiety disorder: Secondary | ICD-10-CM

## 2021-11-17 ENCOUNTER — Other Ambulatory Visit: Payer: Self-pay | Admitting: Family

## 2021-11-17 DIAGNOSIS — I10 Essential (primary) hypertension: Secondary | ICD-10-CM

## 2021-11-29 ENCOUNTER — Other Ambulatory Visit: Payer: Self-pay | Admitting: Family

## 2021-11-29 DIAGNOSIS — E785 Hyperlipidemia, unspecified: Secondary | ICD-10-CM

## 2021-12-15 ENCOUNTER — Other Ambulatory Visit: Payer: Self-pay | Admitting: Family

## 2021-12-15 DIAGNOSIS — I10 Essential (primary) hypertension: Secondary | ICD-10-CM

## 2021-12-22 ENCOUNTER — Other Ambulatory Visit: Payer: Self-pay | Admitting: Family

## 2021-12-22 DIAGNOSIS — F331 Major depressive disorder, recurrent, moderate: Secondary | ICD-10-CM

## 2021-12-22 DIAGNOSIS — F411 Generalized anxiety disorder: Secondary | ICD-10-CM

## 2021-12-29 ENCOUNTER — Other Ambulatory Visit: Payer: Self-pay | Admitting: Family

## 2021-12-29 DIAGNOSIS — I10 Essential (primary) hypertension: Secondary | ICD-10-CM

## 2022-01-04 ENCOUNTER — Other Ambulatory Visit: Payer: Self-pay | Admitting: Family

## 2022-01-04 DIAGNOSIS — E785 Hyperlipidemia, unspecified: Secondary | ICD-10-CM

## 2022-01-05 ENCOUNTER — Ambulatory Visit: Payer: 59 | Admitting: Family

## 2022-01-05 ENCOUNTER — Encounter: Payer: Self-pay | Admitting: Family

## 2022-01-05 ENCOUNTER — Other Ambulatory Visit (HOSPITAL_COMMUNITY)
Admission: RE | Admit: 2022-01-05 | Discharge: 2022-01-05 | Disposition: A | Discharge status: Home / Self Care | Payer: 59 | Source: Ambulatory Visit | Attending: Family | Admitting: Family

## 2022-01-05 VITALS — BP 136/81 | HR 85 | Temp 98.2°F | Ht 63.0 in | Wt 159.8 lb

## 2022-01-05 DIAGNOSIS — F331 Major depressive disorder, recurrent, moderate: Secondary | ICD-10-CM | POA: Diagnosis not present

## 2022-01-05 DIAGNOSIS — Z01419 Encounter for gynecological examination (general) (routine) without abnormal findings: Secondary | ICD-10-CM | POA: Insufficient documentation

## 2022-01-05 DIAGNOSIS — F411 Generalized anxiety disorder: Secondary | ICD-10-CM | POA: Diagnosis not present

## 2022-01-05 DIAGNOSIS — Z1231 Encounter for screening mammogram for malignant neoplasm of breast: Secondary | ICD-10-CM

## 2022-01-05 DIAGNOSIS — R69 Illness, unspecified: Secondary | ICD-10-CM | POA: Diagnosis not present

## 2022-01-05 DIAGNOSIS — Z0001 Encounter for general adult medical examination with abnormal findings: Secondary | ICD-10-CM

## 2022-01-05 DIAGNOSIS — I10 Essential (primary) hypertension: Secondary | ICD-10-CM | POA: Diagnosis not present

## 2022-01-05 DIAGNOSIS — Z0000 Encounter for general adult medical examination without abnormal findings: Secondary | ICD-10-CM | POA: Insufficient documentation

## 2022-01-05 DIAGNOSIS — E559 Vitamin D deficiency, unspecified: Secondary | ICD-10-CM | POA: Diagnosis not present

## 2022-01-05 DIAGNOSIS — E785 Hyperlipidemia, unspecified: Secondary | ICD-10-CM | POA: Diagnosis not present

## 2022-01-05 DIAGNOSIS — F17200 Nicotine dependence, unspecified, uncomplicated: Secondary | ICD-10-CM

## 2022-01-05 MED ORDER — VITAMIN D (ERGOCALCIFEROL) 1.25 MG (50000 UNIT) PO CAPS: 50000.0000 [IU] | ORAL_CAPSULE | ORAL | 1 refills | Status: DC

## 2022-01-05 MED ORDER — BUSPIRONE HCL 7.5 MG PO TABS: 7.5000 mg | ORAL_TABLET | Freq: Two times a day (BID) | ORAL | 2 refills | Status: DC

## 2022-01-05 MED ORDER — CITALOPRAM HYDROBROMIDE 40 MG PO TABS: ORAL_TABLET | ORAL | 3 refills | Status: DC

## 2022-01-05 MED ORDER — ATORVASTATIN CALCIUM 40 MG PO TABS: 40.0000 mg | ORAL_TABLET | Freq: Every day | ORAL | 3 refills | Status: DC

## 2022-01-05 MED ORDER — LOSARTAN POTASSIUM 50 MG PO TABS
50.0000 mg | ORAL_TABLET | Freq: Every day | ORAL | 3 refills | Status: DC
Start: 2022-01-05 — End: 2023-01-08

## 2022-01-05 NOTE — Patient Instructions (Signed)

## 2022-01-05 NOTE — Progress Notes (Signed)
Subjective:    Patient ID: Maria West, female    DOB: 1965/12/15, 55 y.o.   MRN: [id number]  Chief Complaint  Patient presents with   Annual Exam    With pap   Pt presents to the office today for CPE with pap.  Hypertension This is a chronic problem. The current episode started more than 1 year ago. The problem has been resolved since onset. The problem is controlled. Associated symptoms include anxiety and malaise/fatigue. Pertinent negatives include no peripheral edema or shortness of breath. Risk factors for coronary artery disease include dyslipidemia, sedentary lifestyle and smoking/tobacco exposure. The current treatment provides moderate improvement.  Hyperlipidemia This is a chronic problem. The current episode started more than 1 year ago. The problem is uncontrolled. Recent lipid tests were reviewed and are high. Exacerbating diseases include obesity. Factors aggravating her hyperlipidemia include smoking. Pertinent negatives include no shortness of breath. Current antihyperlipidemic treatment includes statins. The current treatment provides moderate improvement of lipids. Risk factors for coronary artery disease include dyslipidemia, hypertension, a sedentary lifestyle and post-menopausal.  Anxiety Presents for follow-up visit. Symptoms include depressed mood, excessive worry, irritability, nervous/anxious behavior and restlessness. Patient reports no shortness of breath. Symptoms occur occasionally. The severity of symptoms is moderate.    Depression        This is a chronic problem.  The current episode started more than 1 year ago.   The onset quality is gradual.   The problem occurs intermittently.  Associated symptoms include restlessness.  Associated symptoms include no helplessness, no hopelessness, not irritable and not sad.  Past treatments include SSRIs - Selective serotonin reuptake inhibitors.  Past medical history includes anxiety.   Nicotine Dependence Presents  for follow-up visit. Symptoms include irritability. Symptoms are negative for sore throat. Her urge triggers include company of smokers. She smokes 1 pack of cigarettes per day.  Gynecologic Exam The patient's pertinent negatives include no genital itching, genital odor, vaginal bleeding or vaginal discharge. Pertinent negatives include no chills, diarrhea, frequency, hematuria, sore throat or urgency.      Review of Systems  Constitutional:  Positive for irritability and malaise/fatigue. Negative for chills.  HENT:  Negative for sore throat.   Respiratory:  Negative for shortness of breath.   Gastrointestinal:  Negative for diarrhea.  Genitourinary:  Negative for frequency, hematuria, urgency and vaginal discharge.  Psychiatric/Behavioral:  Positive for depression. The patient is nervous/anxious.   All other systems reviewed and are negative.  Family History  Problem Relation Age of Onset   Diabetes Mother    Colon cancer Neg Hx    Social History   Socioeconomic History   Marital status: Single    Spouse name: Not on file   Number of children: Not on file   Years of education: Not on file   Highest education level: Not on file  Occupational History   Not on file  Tobacco Use   Smoking status: Every Day    Packs/day: 1.00    Types: Cigarettes    Start date: 04/23/1984   Smokeless tobacco: Never  Substance and Sexual Activity   Alcohol use: Yes    Comment: 2-3 shots whisky per night   Drug use: No   Sexual activity: Not on file  Other Topics Concern   Not on file  Social History Narrative   Not on file   Social Determinants of Health   Financial Resource Strain: Not on file  Food Insecurity: Not on file  Transportation Needs: Not  on file  Physical Activity: Not on file  Stress: Not on file  Social Connections: Not on file       Objective:   Physical Exam Vitals reviewed.  Constitutional:      General: She is not irritable.She is not in acute distress.     Appearance: She is well-developed.  HENT:     Head: Normocephalic and atraumatic.     Right Ear: Tympanic membrane normal.     Left Ear: Tympanic membrane normal.  Eyes:     Pupils: Pupils are equal, round, and reactive to light.  Neck:     Thyroid: No thyromegaly.  Cardiovascular:     Rate and Rhythm: Normal rate and regular rhythm.     Heart sounds: Normal heart sounds. No murmur heard. Pulmonary:     Effort: Pulmonary effort is normal. No respiratory distress.     Breath sounds: Normal breath sounds. No wheezing.  Abdominal:     General: Bowel sounds are normal. There is no distension.     Palpations: Abdomen is soft.     Tenderness: There is no abdominal tenderness.  Genitourinary:    Comments: Bimanual exam- no adnexal masses or tenderness, ovaries nonpalpable   Cervix parous and pink- No discharge  Musculoskeletal:        General: No tenderness. Normal range of motion.     Cervical back: Normal range of motion and neck supple.  Skin:    General: Skin is warm and dry.  Neurological:     Mental Status: She is alert and oriented to person, place, and time.     Cranial Nerves: No cranial nerve deficit.     Deep Tendon Reflexes: Reflexes are normal and symmetric.  Psychiatric:        Behavior: Behavior normal.        Thought Content: Thought content normal.        Judgment: Judgment normal.       BP 136/81   Pulse 85   Temp 98.2 F (36.8 C) (Temporal)   Ht _0  (1.6 m)   Wt 159 lb 12.8 oz (72.5 kg)   LMP 08/18/2009   BMI 28.31 kg/m      Assessment & Plan:  Maria West comes in today with chief complaint of Annual Exam (With pap)   Diagnosis and orders addressed:  1. Essential hypertension - losartan (COZAAR) 50 MG tablet; Take 1 tablet (50 mg total) by mouth daily.  Dispense: 90 tablet; Refill: 3 - CMP14+EGFR - CBC with Differential/Platelet  2. GAD (generalized anxiety disorder) - busPIRone (BUSPAR) 7.5 MG tablet; Take 1 tablet (7.5 mg total) by  mouth 2 (two) times daily.  Dispense: 60 tablet; Refill: 2 - citalopram (CELEXA) 40 MG tablet; TAKE 1 TABLET BY MOUTH ONCE DAILY **NEEDS  TO  BE  SEEN  BEFORE  NEXT  REFILL**  Dispense: 90 tablet; Refill: 3 - CMP14+EGFR - CBC with Differential/Platelet  3. Moderate episode of recurrent major depressive disorder (HCC) - citalopram (CELEXA) 40 MG tablet; TAKE 1 TABLET BY MOUTH ONCE DAILY **NEEDS  TO  BE  SEEN  BEFORE  NEXT  REFILL**  Dispense: 90 tablet; Refill: 3 - CMP14+EGFR - CBC with Differential/Platelet  4. Hyperlipidemia, unspecified hyperlipidemia type - atorvastatin (LIPITOR) 40 MG tablet; Take 1 tablet (40 mg total) by mouth daily. (NEEDS TO BE SEEN BEFORE NEXT REFILL)  Dispense: 90 tablet; Refill: 3 - CMP14+EGFR - CBC with Differential/Platelet  5. Vitamin D deficiency - Vitamin D, Ergocalciferol, (DRISDOL)  1.25 MG (50000 UNIT) CAPS capsule; Take 1 capsule (50,000 Units total) by mouth once a week.  Dispense: 12 capsule; Refill: 1 - CMP14+EGFR - CBC with Differential/Platelet - VITAMIN D 25 Hydroxy (Vit-D Deficiency, Fractures)  6. Annual physical exam - MM Digital Screening; Future - CMP14+EGFR - CBC with Differential/Platelet - Lipid panel - TSH - VITAMIN D 25 Hydroxy (Vit-D Deficiency, Fractures) - Cytology - PAP([place])  7. Current smoker - CMP14+EGFR - CBC with Differential/Platelet  8. Encounter for screening mammogram for malignant neoplasm of breast - MM Digital Screening; Future - CMP14+EGFR - CBC with Differential/Platelet  9. Encounter for gynecological examination without abnormal finding  - Cytology - PAP([place])   Labs pending Health Maintenance reviewed Diet and exercise encouraged  Follow up plan: 6 months  Evelina Dun, FNP

## 2022-01-06 LAB — CMP14+EGFR
ALT: 28 IU/L (ref 0–32)
AST: 30 IU/L (ref 0–40)
Albumin/Globulin Ratio: 2 (ref 1.2–2.2)
Albumin: 4.7 g/dL (ref 3.8–4.9)
Alkaline Phosphatase: 78 IU/L (ref 44–121)
BUN/Creatinine Ratio: 15 (ref 9–23)
BUN: 10 mg/dL (ref 6–24)
Bilirubin Total: 0.4 mg/dL (ref 0.0–1.2)
CO2: 19 mmol/L — ABNORMAL LOW (ref 20–29)
Calcium: 9.7 mg/dL (ref 8.7–10.2)
Chloride: 104 mmol/L (ref 96–106)
Creatinine, Ser: 0.66 mg/dL (ref 0.57–1.00)
Globulin, Total: 2.4 g/dL (ref 1.5–4.5)
Glucose: 98 mg/dL (ref 70–99)
Potassium: 4.3 mmol/L (ref 3.5–5.2)
Sodium: 141 mmol/L (ref 134–144)
Total Protein: 7.1 g/dL (ref 6.0–8.5)
eGFR: 104 mL/min/{1.73_m2} (ref >59)

## 2022-01-06 LAB — LIPID PANEL
Chol/HDL Ratio: 3.2 ratio (ref 0.0–4.4)
Cholesterol, Total: 211 mg/dL — ABNORMAL HIGH (ref 100–199)
HDL: 65 mg/dL (ref >39)
LDL Chol Calc (NIH): 132 mg/dL — ABNORMAL HIGH (ref 0–99)
Triglycerides: 77 mg/dL (ref 0–149)
VLDL Cholesterol Cal: 14 mg/dL (ref 5–40)

## 2022-01-06 LAB — CBC WITH DIFFERENTIAL/PLATELET
Basophils Absolute: 0 10*3/uL (ref 0.0–0.2)
Basos: 1 %
EOS (ABSOLUTE): 0.1 10*3/uL (ref 0.0–0.4)
Eos: 1 %
Hematocrit: 44.4 % (ref 34.0–46.6)
Hemoglobin: 15.3 g/dL (ref 11.1–15.9)
Immature Grans (Abs): 0 10*3/uL (ref 0.0–0.1)
Immature Granulocytes: 0 %
Lymphocytes Absolute: 1.4 10*3/uL (ref 0.7–3.1)
Lymphs: 26 %
MCH: 33.2 pg — ABNORMAL HIGH (ref 26.6–33.0)
MCHC: 34.5 g/dL (ref 31.5–35.7)
MCV: 96 fL (ref 79–97)
Monocytes Absolute: 0.5 10*3/uL (ref 0.1–0.9)
Monocytes: 9 %
Neutrophils Absolute: 3.4 10*3/uL (ref 1.4–7.0)
Neutrophils: 63 %
Platelets: 170 10*3/uL (ref 150–450)
RBC: 4.61 x10E6/uL (ref 3.77–5.28)
RDW: 12.6 % (ref 11.7–15.4)
WBC: 5.4 10*3/uL (ref 3.4–10.8)

## 2022-01-06 LAB — TSH: TSH: 0.972 u[IU]/mL (ref 0.450–4.500)

## 2022-01-06 LAB — VITAMIN D 25 HYDROXY (VIT D DEFICIENCY, FRACTURES): Vit D, 25-Hydroxy: 51.8 ng/mL (ref 30.0–100.0)

## 2022-01-09 LAB — CYTOLOGY - PAP: Diagnosis: NEGATIVE

## 2022-01-17 ENCOUNTER — Other Ambulatory Visit: Payer: Self-pay | Admitting: Podiatrist

## 2022-01-17 DIAGNOSIS — M722 Plantar fascial fibromatosis: Secondary | ICD-10-CM

## 2022-03-01 ENCOUNTER — Other Ambulatory Visit (HOSPITAL_COMMUNITY): Payer: Self-pay | Admitting: Family

## 2022-03-01 DIAGNOSIS — Z1231 Encounter for screening mammogram for malignant neoplasm of breast: Secondary | ICD-10-CM

## 2022-03-19 ENCOUNTER — Ambulatory Visit (HOSPITAL_COMMUNITY)
Admission: RE | Admit: 2022-03-19 | Discharge: 2022-03-19 | Disposition: A | Discharge status: Home / Self Care | Payer: 59 | Source: Ambulatory Visit | Attending: Family | Admitting: Family

## 2022-03-19 DIAGNOSIS — Z1231 Encounter for screening mammogram for malignant neoplasm of breast: Secondary | ICD-10-CM | POA: Diagnosis not present

## 2022-03-22 ENCOUNTER — Inpatient Hospital Stay
Admission: RE | Admit: 2022-03-22 | Discharge: 2022-03-22 | Disposition: A | Discharge status: Home / Self Care | Payer: Self-pay | Source: Ambulatory Visit | Attending: Family | Admitting: Family

## 2022-03-22 ENCOUNTER — Other Ambulatory Visit (HOSPITAL_COMMUNITY): Payer: Self-pay | Admitting: Family

## 2022-03-22 DIAGNOSIS — Z1231 Encounter for screening mammogram for malignant neoplasm of breast: Secondary | ICD-10-CM

## 2022-04-10 ENCOUNTER — Ambulatory Visit: Payer: 59 | Admitting: Family

## 2022-04-10 ENCOUNTER — Encounter: Payer: Self-pay | Admitting: Family

## 2022-04-10 VITALS — BP 130/73 | HR 68 | Temp 97.8°F | Ht 63.0 in | Wt 164.2 lb

## 2022-04-10 DIAGNOSIS — R5383 Other fatigue: Secondary | ICD-10-CM

## 2022-04-10 DIAGNOSIS — L659 Nonscarring hair loss, unspecified: Secondary | ICD-10-CM

## 2022-04-10 DIAGNOSIS — F17200 Nicotine dependence, unspecified, uncomplicated: Secondary | ICD-10-CM

## 2022-04-10 DIAGNOSIS — R69 Illness, unspecified: Secondary | ICD-10-CM | POA: Diagnosis not present

## 2022-04-10 NOTE — Patient Instructions (Signed)
Fatigue If you have fatigue, you feel tired all the time and have a lack of energy or a lack of motivation. Fatigue may make it difficult to start or complete tasks because of exhaustion. Occasional or mild fatigue is often a normal response to activity or life. However, long-term (chronic) or extreme fatigue may be a symptom of a medical condition such as: Depression. Not having enough red blood cells or hemoglobin in the blood (anemia). A problem with a small gland located in the lower front part of the neck (thyroid disorder). Rheumatologic conditions. These are problems related to the body's defense system (immune system). Infections, especially certain viral infections. Fatigue can also lead to negative health outcomes over time. Follow these instructions at home: Medicines Take over-the-counter and prescription medicines only as told by your health care provider. Take a multivitamin if told by your health care provider. Do not use herbal or dietary supplements unless they are approved by your health care provider. Eating and drinking  Avoid heavy meals in the evening. Eat a well-balanced diet, which includes lean proteins, whole grains, plenty of fruits and vegetables, and low-fat dairy products. Avoid eating or drinking too many products with caffeine in them. Avoid alcohol. Drink enough fluid to keep your urine pale yellow. Activity  Exercise regularly, as told by your health care provider. Use or practice techniques to help you relax, such as yoga, tai chi, meditation, or massage therapy. Lifestyle Change situations that cause you stress. Try to keep your work and personal schedules in balance. Do not use recreational or illegal drugs. General instructions Monitor your fatigue for any changes. Go to bed and get up at the same time every day. Avoid fatigue by pacing yourself during the day and getting enough sleep at night. Maintain a healthy weight. Contact a health care  provider if: Your fatigue does not get better. You have a fever. You suddenly lose or gain weight. You have headaches. You have trouble falling asleep or sleeping through the night. You feel angry, guilty, anxious, or sad. You have swelling in your legs or another part of your body. Get help right away if: You feel confused, feel like you might faint, or faint. Your vision is blurry or you have a severe headache. You have severe pain in your abdomen, your back, or the area between your waist and hips (pelvis). You have chest pain, shortness of breath, or an irregular or fast heartbeat. You are unable to urinate, or you urinate less than normal. You have abnormal bleeding from the rectum, nose, lungs, nipples, or, if you are female, the vagina. You vomit blood. You have thoughts about hurting yourself or others. These symptoms may be an emergency. Get help right away. Call 911. Do not wait to see if the symptoms will go away. Do not drive yourself to the hospital. Get help right away if you feel like you may hurt yourself or others, or have thoughts about taking your own life. Go to your nearest emergency room or: Call 911. Call the [facility] at [phone] or 988. This is open 24 hours a day. Text the Crisis Text Line at 979-823-0385. Summary If you have fatigue, you feel tired all the time and have a lack of energy or a lack of motivation. Fatigue may make it difficult to start or complete tasks because of exhaustion. Long-term (chronic) or extreme fatigue may be a symptom of a medical condition. Exercise regularly, as told by your health care provider.  Change situations that cause you stress. Try to keep your work and personal schedules in balance. This information is not intended to replace advice given to you by your health care provider. Make sure you discuss any questions you have with your health care provider. Document Revised: 01/30/2021 Document  Reviewed: 01/30/2021 Elsevier Patient Education  2023 [employer]  

## 2022-04-10 NOTE — Progress Notes (Signed)
   Subjective:    Patient ID: Maria West, female    DOB: 08-Apr-1966, 56 y.o.   MRN: [id number]  Chief Complaint  Patient presents with   Fatigue   Hair/Scalp Problem   PT presents to the office today today with fatigue and hair loss that started two months ago. Denies any recent COVID, fever, chest pain, or SOB.   Reports is has slightly improved.  Nicotine Dependence Presents for follow-up visit. Her urge triggers include company of smokers. She smokes 1 pack (1-1.5 packs  a day) of cigarettes per day.      Review of Systems  All other systems reviewed and are negative.      Objective:   Physical Exam Vitals reviewed.  Constitutional:      General: She is not in acute distress.    Appearance: She is well-developed.  HENT:     Head: Normocephalic and atraumatic.  Eyes:     Pupils: Pupils are equal, round, and reactive to light.  Neck:     Thyroid: No thyromegaly.  Cardiovascular:     Rate and Rhythm: Normal rate and regular rhythm.     Heart sounds: Normal heart sounds. No murmur heard. Pulmonary:     Effort: Pulmonary effort is normal. No respiratory distress.     Breath sounds: Rhonchi present. No wheezing.  Abdominal:     General: Bowel sounds are normal. There is no distension.     Palpations: Abdomen is soft.     Tenderness: There is no abdominal tenderness.  Musculoskeletal:        General: No tenderness. Normal range of motion.     Cervical back: Normal range of motion and neck supple.  Skin:    General: Skin is warm and dry.  Neurological:     Mental Status: She is alert and oriented to person, place, and time.     Cranial Nerves: No cranial nerve deficit.     Deep Tendon Reflexes: Reflexes are normal and symmetric.  Psychiatric:        Behavior: Behavior normal.        Thought Content: Thought content normal.        Judgment: Judgment normal.      BP 130/73   Pulse 68   Temp 97.8 F (36.6 C)   Ht _0  (1.6 m)   Wt 164 lb 3.2 oz (74.5  kg)   LMP 08/18/2009   SpO2 98%   BMI 29.09 kg/m       Assessment & Plan:   [name] comes in today with chief complaint of Fatigue and Hair/Scalp Problem   Diagnosis and orders addressed:  1. Other fatigue - CMP14+EGFR - Anemia Profile B - TSH  2. Hair loss - CMP14+EGFR - Anemia Profile B - TSH  3. Current smoker - CMP14+EGFR   Labs pending Discussed if all labs normal, could be related to mild case of COVID that she did not test for. If COVID related, should resolve in 6 months. Health Maintenance reviewed Diet and exercise encouraged  Follow up plan: Keep chronic follow up   Evelina Dun, FNP

## 2022-04-11 LAB — ANEMIA PROFILE B
Basophils Absolute: 0.1 10*3/uL (ref 0.0–0.2)
Basos: 1 %
EOS (ABSOLUTE): 0.1 10*3/uL (ref 0.0–0.4)
Eos: 1 %
Ferritin: 187 ng/mL — ABNORMAL HIGH (ref 15–150)
Folate: 16.9 ng/mL (ref >3.0)
Hematocrit: 41.9 % (ref 34.0–46.6)
Hemoglobin: 14.4 g/dL (ref 11.1–15.9)
Immature Grans (Abs): 0 10*3/uL (ref 0.0–0.1)
Immature Granulocytes: 0 %
Iron Saturation: 31 % (ref 15–55)
Iron: 99 ug/dL (ref 27–159)
Lymphocytes Absolute: 2.1 10*3/uL (ref 0.7–3.1)
Lymphs: 34 %
MCH: 32.9 pg (ref 26.6–33.0)
MCHC: 34.4 g/dL (ref 31.5–35.7)
MCV: 96 fL (ref 79–97)
Monocytes Absolute: 0.7 10*3/uL (ref 0.1–0.9)
Monocytes: 12 %
Neutrophils Absolute: 3.1 10*3/uL (ref 1.4–7.0)
Neutrophils: 52 %
Platelets: 175 10*3/uL (ref 150–450)
RBC: 4.38 x10E6/uL (ref 3.77–5.28)
RDW: 12.2 % (ref 11.7–15.4)
Retic Ct Pct: 1.3 % (ref 0.6–2.6)
Total Iron Binding Capacity: 318 ug/dL (ref 250–450)
UIBC: 219 ug/dL (ref 131–425)
Vitamin B-12: 667 pg/mL (ref 232–1245)
WBC: 6.1 10*3/uL (ref 3.4–10.8)

## 2022-04-11 LAB — CMP14+EGFR
ALT: 21 IU/L (ref 0–32)
AST: 22 IU/L (ref 0–40)
Albumin/Globulin Ratio: 2 (ref 1.2–2.2)
Albumin: 4.5 g/dL (ref 3.8–4.9)
Alkaline Phosphatase: 71 IU/L (ref 44–121)
BUN/Creatinine Ratio: 18 (ref 9–23)
BUN: 12 mg/dL (ref 6–24)
Bilirubin Total: 0.2 mg/dL (ref 0.0–1.2)
CO2: 22 mmol/L (ref 20–29)
Calcium: 9.4 mg/dL (ref 8.7–10.2)
Chloride: 104 mmol/L (ref 96–106)
Creatinine, Ser: 0.68 mg/dL (ref 0.57–1.00)
Globulin, Total: 2.2 g/dL (ref 1.5–4.5)
Glucose: 104 mg/dL — ABNORMAL HIGH (ref 70–99)
Potassium: 4.1 mmol/L (ref 3.5–5.2)
Sodium: 141 mmol/L (ref 134–144)
Total Protein: 6.7 g/dL (ref 6.0–8.5)
eGFR: 103 mL/min/{1.73_m2} (ref >59)

## 2022-04-11 LAB — TSH: TSH: 1.44 u[IU]/mL (ref 0.450–4.500)

## 2022-04-27 IMAGING — CT CT ABD-PELV W/ CM
2 of 5 series · 15 of 46 positions shown, 17 images · IV contrast (omnipaque)
Comparison: None.

CLINICAL DATA: Elevated transaminases, fatigue

EXAM:
CT ABDOMEN AND PELVIS WITH CONTRAST
TECHNIQUE: Multidetector CT imaging of the abdomen and pelvis was performed
using the standard protocol following bolus administration of
intravenous contrast.
CONTRAST:  100mL OMNIPAQUE IOHEXOL 300 MG/ML  SOLN

[Series 2: axial st · axial · 0.73mm/px · z∈[+1004,+1424]mm · 12 of 97 slices shown, 14 images]
[im 7/97  soft-tissue]
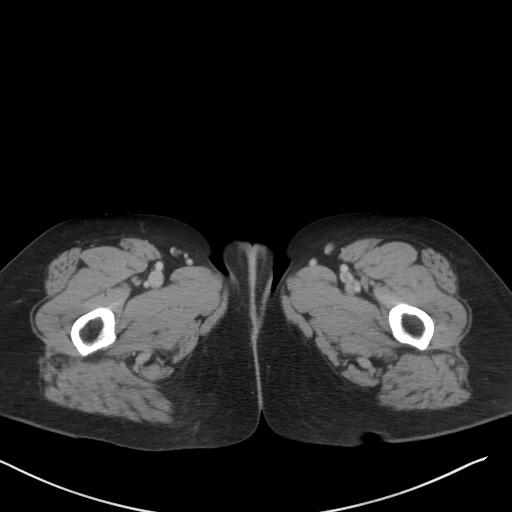
[im 7/97  bone]
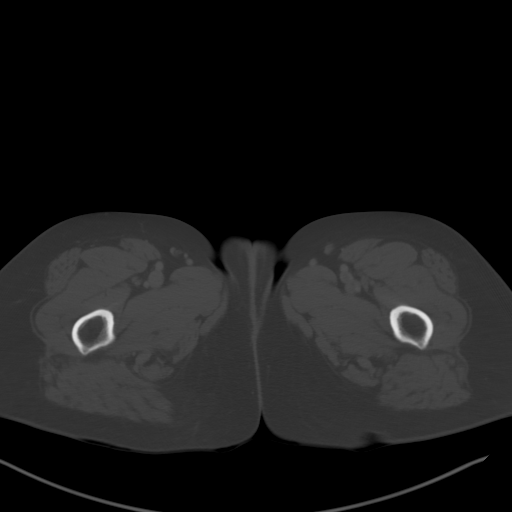
[im 13/97  soft-tissue]
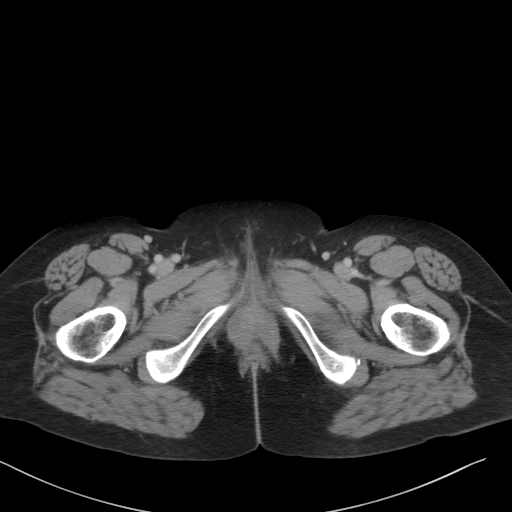
[im 25/97  soft-tissue]
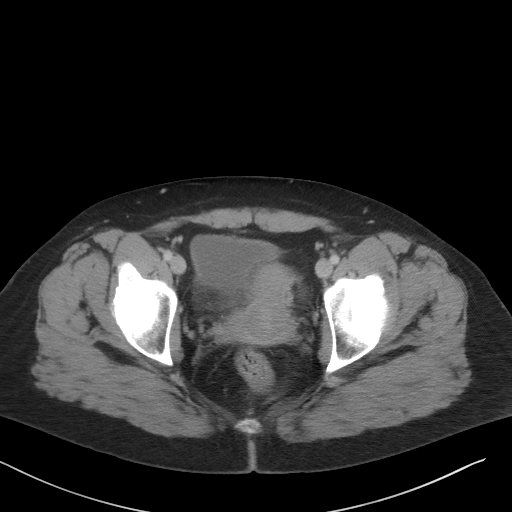
[im 31/97  soft-tissue]
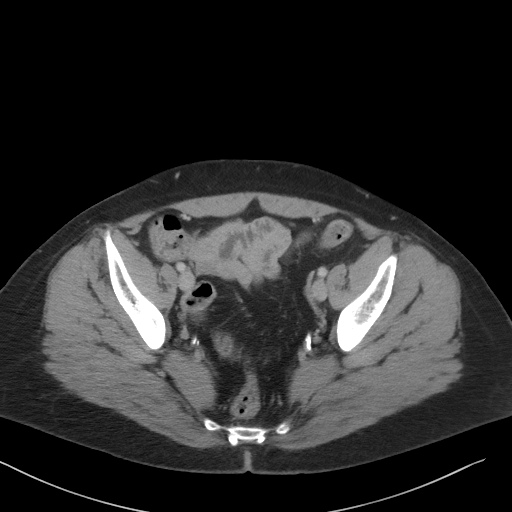
[im 37/97  soft-tissue]
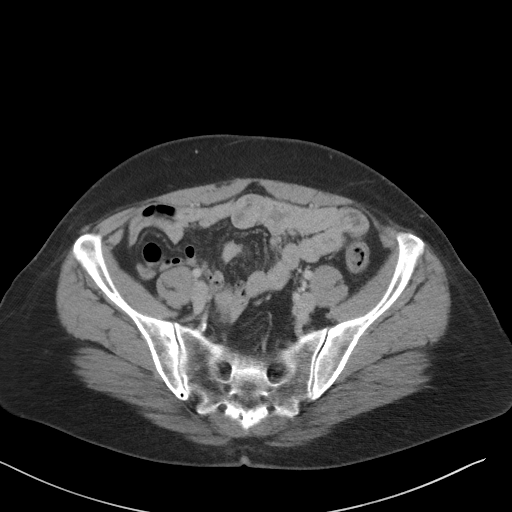
[im 43/97  soft-tissue]
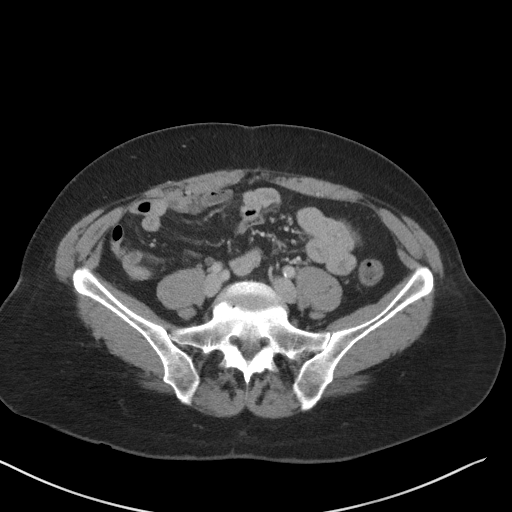
[im 55/97  soft-tissue]
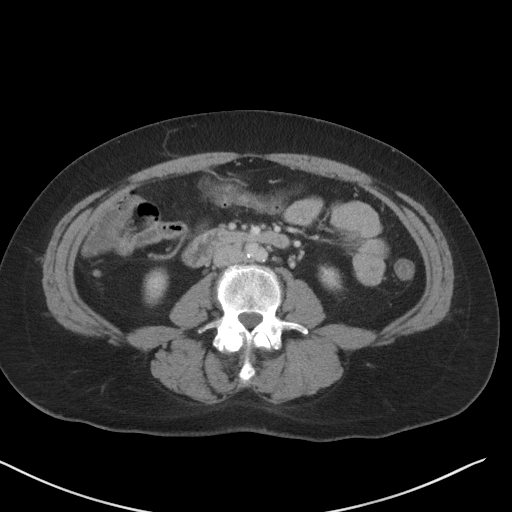
[im 61/97  soft-tissue]
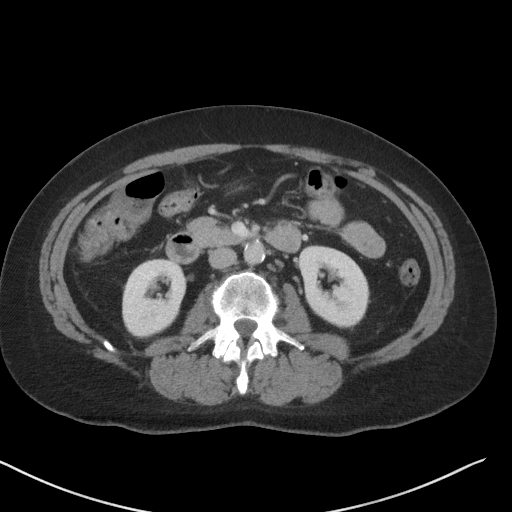
[im 67/97  soft-tissue]
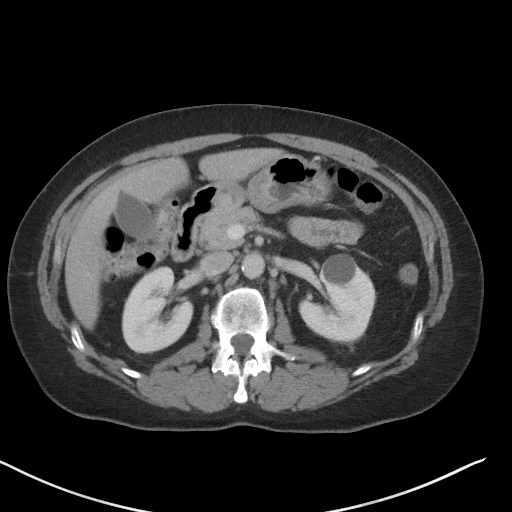
[im 67/97  bone]
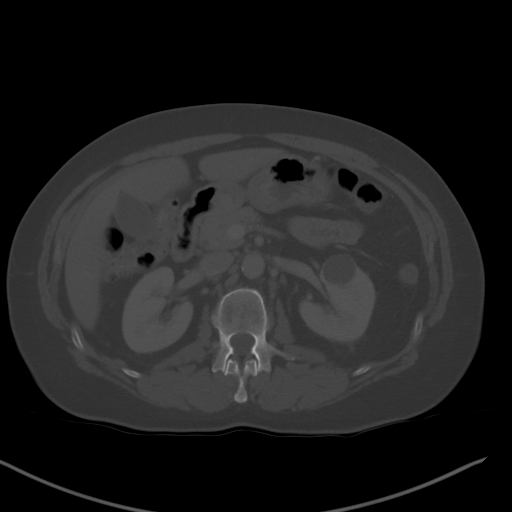
[im 73/97  soft-tissue]
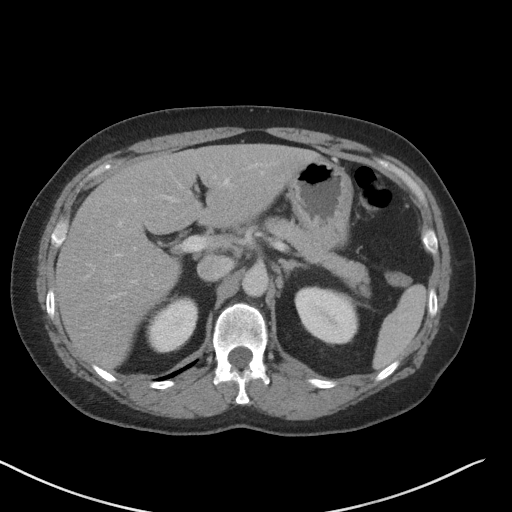
[im 85/97  soft-tissue]
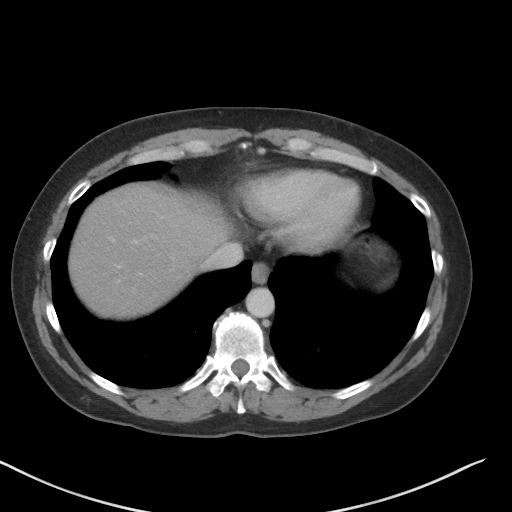
[im 91/97  soft-tissue]
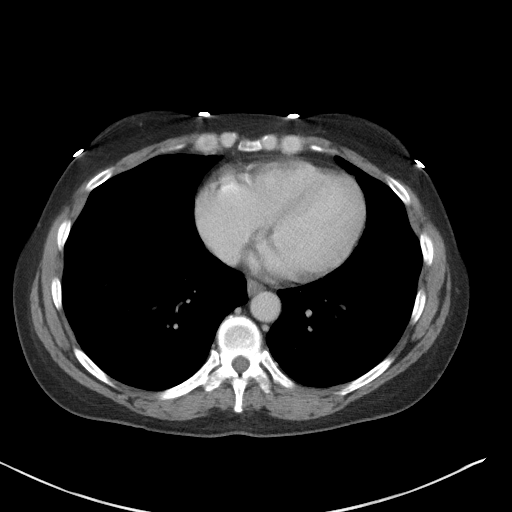

[Series 5: coronal st · coronal · 0.71mm/px · 3 of 88 slices shown]
[im 30/88  soft-tissue]
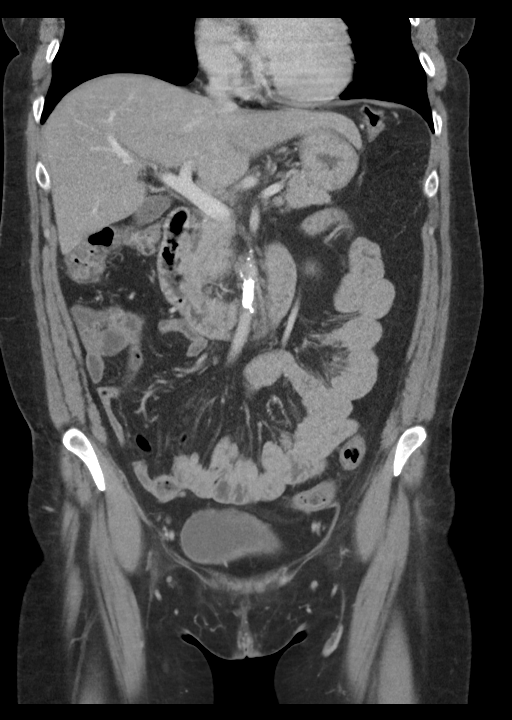
[im 39/88  soft-tissue]
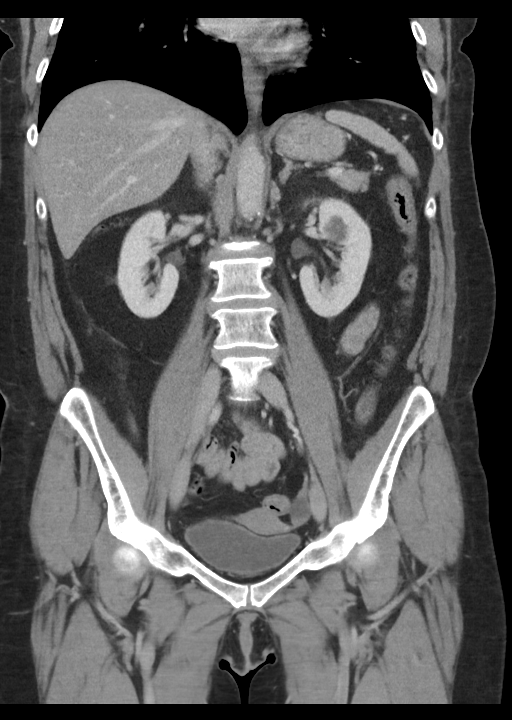
[im 49/88  soft-tissue]
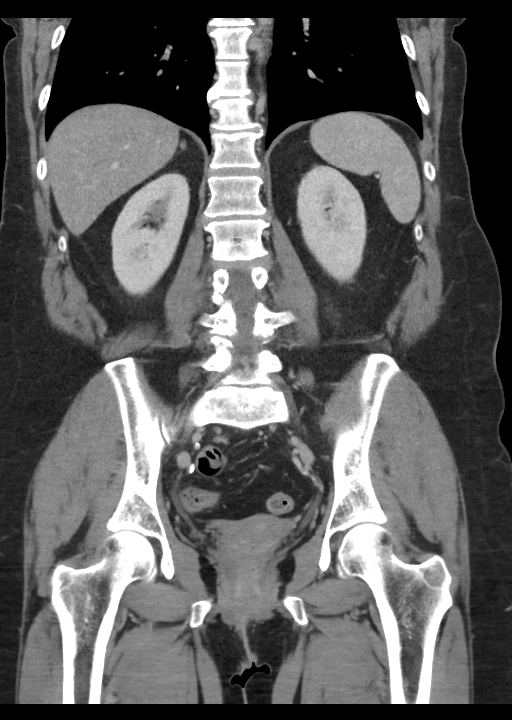

[15 of 46 positions shown; findings below may reference images not displayed]

FINDINGS: Lower chest: No acute abnormality.  Coronary artery calcifications.

Hepatobiliary: No solid liver abnormality is seen. Hepatic
steatosis. No gallstones, gallbladder wall thickening, or biliary
dilatation.

Pancreas: Unremarkable. No pancreatic ductal dilatation or
surrounding inflammatory changes.

Spleen: Normal in size without significant abnormality.

Adrenals/Urinary Tract: Adrenal glands are unremarkable. Kidneys are
normal, without renal calculi, solid lesion, or hydronephrosis.
Bladder is unremarkable.

Stomach/Bowel: Stomach is within normal limits. Appendix appears
normal. The colon appears mildly thickened with vascular combing,
particularly with a somewhat featureless appearance of the
descending and sigmoid colon (series 2, image 68, series 5, image
36).

Vascular/Lymphatic: Aortic atherosclerosis. No enlarged abdominal or
pelvic lymph nodes.

Reproductive: No mass or other significant abnormality.

Other: No abdominal wall hernia or abnormality. No abdominopelvic
ascites.

Musculoskeletal: No acute or significant osseous findings.
IMPRESSION: 1. No acute CT findings of the abdomen or pelvis to explain abnormal
LFTs.

2.  Hepatic steatosis.

3. The colon appears mildly thickened with vascular combing,
particularly with a somewhat featureless appearance of the
descending and sigmoid colon. This appearance is generally
suggestive of a nonspecific infectious or inflammatory colitis,
including inflammatory bowel disease such as ulcerative colitis.
Correlate with referable clinical signs and symptoms, if present.

4.  Coronary artery disease.  Aortic Atherosclerosis ([id number]-A71.1).

## 2022-07-22 ENCOUNTER — Other Ambulatory Visit: Payer: Self-pay | Admitting: Family

## 2022-07-22 DIAGNOSIS — E559 Vitamin D deficiency, unspecified: Secondary | ICD-10-CM

## 2022-10-23 ENCOUNTER — Other Ambulatory Visit: Payer: Self-pay | Admitting: Family

## 2022-10-23 DIAGNOSIS — E559 Vitamin D deficiency, unspecified: Secondary | ICD-10-CM

## 2022-12-05 ENCOUNTER — Telehealth: Payer: Self-pay | Admitting: *Deleted

## 2022-12-05 NOTE — Telephone Encounter (Signed)
I connected with Maria West on 8/14 at 1033 by telephone and verified that I am speaking with the correct person using two identifiers. According to the patient's chart they are due for physical with [facility]. Pt scheduled. There are no transportation issues at this time. Nothing further was needed at the end of our conversation.

## 2023-01-08 ENCOUNTER — Ambulatory Visit (INDEPENDENT_AMBULATORY_CARE_PROVIDER_SITE_OTHER): Payer: Medicaid Other | Admitting: Family

## 2023-01-08 ENCOUNTER — Encounter: Payer: Self-pay | Admitting: Family

## 2023-01-08 VITALS — BP 152/92 | HR 76 | Temp 98.3°F | Ht 63.0 in | Wt 160.4 lb

## 2023-01-08 DIAGNOSIS — F411 Generalized anxiety disorder: Secondary | ICD-10-CM

## 2023-01-08 DIAGNOSIS — Z0000 Encounter for general adult medical examination without abnormal findings: Secondary | ICD-10-CM

## 2023-01-08 DIAGNOSIS — Z0001 Encounter for general adult medical examination with abnormal findings: Secondary | ICD-10-CM

## 2023-01-08 DIAGNOSIS — E785 Hyperlipidemia, unspecified: Secondary | ICD-10-CM | POA: Diagnosis not present

## 2023-01-08 DIAGNOSIS — F17200 Nicotine dependence, unspecified, uncomplicated: Secondary | ICD-10-CM

## 2023-01-08 DIAGNOSIS — I10 Essential (primary) hypertension: Secondary | ICD-10-CM

## 2023-01-08 DIAGNOSIS — F331 Major depressive disorder, recurrent, moderate: Secondary | ICD-10-CM

## 2023-01-08 DIAGNOSIS — E559 Vitamin D deficiency, unspecified: Secondary | ICD-10-CM

## 2023-01-08 MED ORDER — ATORVASTATIN CALCIUM 40 MG PO TABS: 40.0000 mg | ORAL_TABLET | Freq: Every day | ORAL | 3 refills | Status: DC

## 2023-01-08 MED ORDER — LOSARTAN POTASSIUM 50 MG PO TABS: 50.0000 mg | ORAL_TABLET | Freq: Every day | ORAL | 3 refills | Status: DC

## 2023-01-08 MED ORDER — CITALOPRAM HYDROBROMIDE 40 MG PO TABS: ORAL_TABLET | ORAL | 3 refills | Status: DC

## 2023-01-08 MED ORDER — BUSPIRONE HCL 7.5 MG PO TABS: 7.5000 mg | ORAL_TABLET | Freq: Two times a day (BID) | ORAL | 2 refills | Status: DC

## 2023-01-08 MED ORDER — VITAMIN D (ERGOCALCIFEROL) 1.25 MG (50000 UNIT) PO CAPS: 50000.0000 [IU] | ORAL_CAPSULE | ORAL | 0 refills | Status: DC

## 2023-01-08 NOTE — Patient Instructions (Signed)
Hypertension, Adult High blood pressure (hypertension) is when the force of blood pumping through the arteries is too strong. The arteries are the blood vessels that carry blood from the heart throughout the body. Hypertension forces the heart to work harder to pump blood and may cause arteries to become narrow or stiff. Untreated or uncontrolled hypertension can lead to a heart attack, heart failure, a stroke, kidney disease, and other problems. A blood pressure reading consists of a higher number over a lower number. Ideally, your blood pressure should be below 120/80. The first ("top") number is called the systolic pressure. It is a measure of the pressure in your arteries as your heart beats. The second ("bottom") number is called the diastolic pressure. It is a measure of the pressure in your arteries as the heart relaxes. What are the causes? The exact cause of this condition is not known. There are some conditions that result in high blood pressure. What increases the risk? Certain factors may make you more likely to develop high blood pressure. Some of these risk factors are under your control, including: Smoking. Not getting enough exercise or physical activity. Being overweight. Having too much fat, sugar, calories, or salt (sodium) in your diet. Drinking too much alcohol. Other risk factors include: Having a personal history of heart disease, diabetes, high cholesterol, or kidney disease. Stress. Having a family history of high blood pressure and high cholesterol. Having obstructive sleep apnea. Age. The risk increases with age. What are the signs or symptoms? High blood pressure may not cause symptoms. Very high blood pressure (hypertensive crisis) may cause: Headache. Fast or irregular heartbeats (palpitations). Shortness of breath. Nosebleed. Nausea and vomiting. Vision changes. Severe chest pain, dizziness, and seizures. How is this diagnosed? This condition is diagnosed by  measuring your blood pressure while you are seated, with your arm resting on a flat surface, your legs uncrossed, and your feet flat on the floor. The cuff of the blood pressure monitor will be placed directly against the skin of your upper arm at the level of your heart. Blood pressure should be measured at least twice using the same arm. Certain conditions can cause a difference in blood pressure between your right and left arms. If you have a high blood pressure reading during one visit or you have normal blood pressure with other risk factors, you may be asked to: Return on a different day to have your blood pressure checked again. Monitor your blood pressure at home for 1 week or longer. If you are diagnosed with hypertension, you may have other blood or imaging tests to help your health care provider understand your overall risk for other conditions. How is this treated? This condition is treated by making healthy lifestyle changes, such as eating healthy foods, exercising more, and reducing your alcohol intake. You may be referred for counseling on a healthy diet and physical activity. Your health care provider may prescribe medicine if lifestyle changes are not enough to get your blood pressure under control and if: Your systolic blood pressure is above 130. Your diastolic blood pressure is above 80. Your personal target blood pressure may vary depending on your medical conditions, your age, and other factors. Follow these instructions at home: Eating and drinking  Eat a diet that is high in fiber and potassium, and low in sodium, added sugar, and fat. An example of this eating plan is called the DASH diet. DASH stands for Dietary Approaches to Stop Hypertension. To eat this way: Eat  plenty of fresh fruits and vegetables. Try to fill one half of your plate at each meal with fruits and vegetables. Eat whole grains, such as whole-wheat pasta, brown rice, or whole-grain bread. Fill about one  fourth of your plate with whole grains. Eat or drink low-fat dairy products, such as skim milk or low-fat yogurt. Avoid fatty cuts of meat, processed or cured meats, and poultry with skin. Fill about one fourth of your plate with lean proteins, such as fish, chicken without skin, beans, eggs, or tofu. Avoid pre-made and processed foods. These tend to be higher in sodium, added sugar, and fat. Reduce your daily sodium intake. Many people with hypertension should eat less than 1,500 mg of sodium a day. Do not drink alcohol if: Your health care provider tells you not to drink. You are pregnant, may be pregnant, or are planning to become pregnant. If you drink alcohol: Limit how much you have to: 0-1 drink a day for women. 0-2 drinks a day for men. Know how much alcohol is in your drink. In the [place] one drink equals one 12 oz bottle of beer (355 mL), one 5 oz glass of wine (148 mL), or one 1 oz glass of hard liquor (44 mL). Lifestyle  Work with your health care provider to maintain a healthy body weight or to lose weight. Ask what an ideal weight is for you. Get at least 30 minutes of exercise that causes your heart to beat faster (aerobic exercise) most days of the week. Activities may include walking, swimming, or biking. Include exercise to strengthen your muscles (resistance exercise), such as Pilates or lifting weights, as part of your weekly exercise routine. Try to do these types of exercises for 30 minutes at least 3 days a week. Do not use any products that contain nicotine or tobacco. These products include cigarettes, chewing tobacco, and vaping devices, such as e-cigarettes. If you need help quitting, ask your health care provider. Monitor your blood pressure at home as told by your health care provider. Keep all follow-up visits. This is important. Medicines Take over-the-counter and prescription medicines only as told by your health care provider. Follow directions carefully. Blood  pressure medicines must be taken as prescribed. Do not skip doses of blood pressure medicine. Doing this puts you at risk for problems and can make the medicine less effective. Ask your health care provider about side effects or reactions to medicines that you should watch for. Contact a health care provider if you: Think you are having a reaction to a medicine you are taking. Have headaches that keep coming back (recurring). Feel dizzy. Have swelling in your ankles. Have trouble with your vision. Get help right away if you: Develop a severe headache or confusion. Have unusual weakness or numbness. Feel faint. Have severe pain in your chest or abdomen. Vomit repeatedly. Have trouble breathing. These symptoms may be an emergency. Get help right away. Call 911. Do not wait to see if the symptoms will go away. Do not drive yourself to the hospital. Summary Hypertension is when the force of blood pumping through your arteries is too strong. If this condition is not controlled, it may put you at risk for serious complications. Your personal target blood pressure may vary depending on your medical conditions, your age, and other factors. For most people, a normal blood pressure is less than 120/80. Hypertension is treated with lifestyle changes, medicines, or a combination of both. Lifestyle changes include losing weight, eating a healthy,  low-sodium diet, exercising more, and limiting alcohol. This information is not intended to replace advice given to you by your health care provider. Make sure you discuss any questions you have with your health care provider. Document Revised: 02/14/2021 Document Reviewed: 02/14/2021 Elsevier Patient Education  2024 [employer].

## 2023-01-08 NOTE — Progress Notes (Signed)
Subjective:    Patient ID: Maria West, female    DOB: 24-Jun-1965, 57 y.o.   MRN: [id number]  Chief Complaint  Patient presents with   Annual Exam   Pt presents to the office today for CPE without pap.  Hypertension This is a chronic problem. The current episode started more than 1 year ago. The problem has been waxing and waning since onset. The problem is uncontrolled. Associated symptoms include anxiety and malaise/fatigue. Pertinent negatives include no peripheral edema or shortness of breath. Risk factors for coronary artery disease include dyslipidemia and sedentary lifestyle. The current treatment provides moderate improvement.  Hyperlipidemia This is a chronic problem. The current episode started more than 1 year ago. The problem is controlled. Recent lipid tests were reviewed and are normal. Pertinent negatives include no shortness of breath. Current antihyperlipidemic treatment includes statins. The current treatment provides moderate improvement of lipids. Risk factors for coronary artery disease include dyslipidemia, hypertension and a sedentary lifestyle.  Anxiety Presents for follow-up visit. Symptoms include excessive worry, nervous/anxious behavior and restlessness. Patient reports no shortness of breath. Symptoms occur occasionally. The severity of symptoms is moderate.    Depression        This is a chronic problem.  The current episode started more than 1 year ago.   The problem occurs intermittently.  Associated symptoms include fatigue and restlessness.  Associated symptoms include no helplessness, no hopelessness and not sad.  Past treatments include SSRIs - Selective serotonin reuptake inhibitors.  Past medical history includes anxiety.   Nicotine Dependence Presents for follow-up visit. Symptoms include fatigue. Her urge triggers include company of smokers. The symptoms have been stable. She smokes 1 pack of cigarettes per day.      Review of Systems   Constitutional:  Positive for fatigue and malaise/fatigue.  Respiratory:  Negative for shortness of breath.   Psychiatric/Behavioral:  Positive for depression. The patient is nervous/anxious.   All other systems reviewed and are negative.  Family History  Problem Relation Age of Onset   Diabetes Mother    Colon cancer Neg Hx    Social History   Socioeconomic History   Marital status: Single    Spouse name: Not on file   Number of children: Not on file   Years of education: Not on file   Highest education level: Not on file  Occupational History   Not on file  Tobacco Use   Smoking status: Every Day    Current packs/day: 1.00    Average packs/day: 1 pack/day for 38.7 years (38.7 ttl pk-yrs)    Types: Cigarettes    Start date: 04/23/1984   Smokeless tobacco: Never  Substance and Sexual Activity   Alcohol use: Yes    Comment: 2-3 shots whisky per night   Drug use: No   Sexual activity: Not on file  Other Topics Concern   Not on file  Social History Narrative   Not on file   Social Determinants of Health   Financial Resource Strain: Not on file  Food Insecurity: Not on file  Transportation Needs: No Transportation Needs (12/05/2022)   PRAPARE - Administrator, Civil Service (Medical): No    Lack of Transportation (Non-Medical): No  Physical Activity: Not on file  Stress: Not on file  Social Connections: Unknown (09/04/2021)   Received from [facility]   Social Network    Social Network: Not on file        Objective:   Physical  Exam Vitals reviewed.  Constitutional:      General: She is not in acute distress.    Appearance: She is well-developed.  HENT:     Head: Normocephalic and atraumatic.     Right Ear: Tympanic membrane normal.     Left Ear: Tympanic membrane normal.  Eyes:     Pupils: Pupils are equal, round, and reactive to light.  Neck:     Thyroid: No thyromegaly.  Cardiovascular:     Rate and Rhythm: Normal rate and  regular rhythm.     Heart sounds: Normal heart sounds. No murmur heard. Pulmonary:     Effort: Pulmonary effort is normal. No respiratory distress.     Breath sounds: Normal breath sounds. No wheezing.  Abdominal:     General: Bowel sounds are normal. There is no distension.     Palpations: Abdomen is soft.     Tenderness: There is no abdominal tenderness.  Musculoskeletal:        General: No tenderness. Normal range of motion.     Cervical back: Normal range of motion and neck supple.  Skin:    General: Skin is warm and dry.  Neurological:     Mental Status: She is alert and oriented to person, place, and time.     Cranial Nerves: No cranial nerve deficit.     Deep Tendon Reflexes: Reflexes are normal and symmetric.  Psychiatric:        Behavior: Behavior normal.        Thought Content: Thought content normal.        Judgment: Judgment normal.       BP (!) 145/81   Pulse 76   Temp 98.3 F (36.8 C) (Temporal)   Ht 5\' 3"  (1.6 m)   Wt 160 lb 6.4 oz (72.8 kg)   LMP 08/18/2009   SpO2 94%   BMI 28.41 kg/m      Assessment & Plan:  Maria West comes in today with chief complaint of Annual Exam   Diagnosis and orders addressed:  1. Hyperlipidemia, unspecified hyperlipidemia type - atorvastatin (LIPITOR) 40 MG tablet; Take 1 tablet (40 mg total) by mouth daily. (NEEDS TO BE SEEN BEFORE NEXT REFILL)  Dispense: 90 tablet; Refill: 3 - CBC with Differential/Platelet - CMP14+EGFR - Lipid panel  2. GAD (generalized anxiety disorder) - busPIRone (BUSPAR) 7.5 MG tablet; Take 1 tablet (7.5 mg total) by mouth 2 (two) times daily.  Dispense: 60 tablet; Refill: 2 - citalopram (CELEXA) 40 MG tablet; TAKE 1 TABLET BY MOUTH ONCE DAILY **NEEDS  TO  BE  SEEN  BEFORE  NEXT  REFILL**  Dispense: 90 tablet; Refill: 3 - CBC with Differential/Platelet - CMP14+EGFR  3. Moderate episode of recurrent major depressive disorder (HCC) - citalopram (CELEXA) 40 MG tablet; TAKE 1 TABLET BY MOUTH  ONCE DAILY **NEEDS  TO  BE  SEEN  BEFORE  NEXT  REFILL**  Dispense: 90 tablet; Refill: 3 - CBC with Differential/Platelet - CMP14+EGFR  4. Essential hypertension - losartan (COZAAR) 50 MG tablet; Take 1 tablet (50 mg total) by mouth daily.  Dispense: 90 tablet; Refill: 3 - CBC with Differential/Platelet - CMP14+EGFR  5. Vitamin D deficiency - Vitamin D, Ergocalciferol, (DRISDOL) 1.25 MG (50000 UNIT) CAPS capsule; Take 1 capsule (50,000 Units total) by mouth once a week.  Dispense: 12 capsule; Refill: 0 - CBC with Differential/Platelet - CMP14+EGFR - VITAMIN D 25 Hydroxy (Vit-D Deficiency, Fractures)  6. Annual physical exam - CBC with Differential/Platelet - CMP14+EGFR -  Lipid panel - TSH - VITAMIN D 25 Hydroxy (Vit-D Deficiency, Fractures)  7. Current smoker - CBC with Differential/Platelet - CMP14+EGFR   Labs pending BP elevated today, pt reports being stressed at work. Will try low salt diet, lose 5-10 lbs, decrease smoking. If elevated on next visit will need to increase medication.  Health Maintenance reviewed Diet and exercise encouraged  Follow up plan: 3 months for HTN   Jannifer Rodney, FNP

## 2023-01-09 LAB — CBC WITH DIFFERENTIAL/PLATELET
Basophils Absolute: 0 10*3/uL (ref 0.0–0.2)
Basos: 1 %
EOS (ABSOLUTE): 0.1 10*3/uL (ref 0.0–0.4)
Eos: 2 %
Hematocrit: 44.4 % (ref 34.0–46.6)
Hemoglobin: 15 g/dL (ref 11.1–15.9)
Immature Grans (Abs): 0 10*3/uL (ref 0.0–0.1)
Immature Granulocytes: 0 %
Lymphocytes Absolute: 1.2 10*3/uL (ref 0.7–3.1)
Lymphs: 22 %
MCH: 33.2 pg — ABNORMAL HIGH (ref 26.6–33.0)
MCHC: 33.8 g/dL (ref 31.5–35.7)
MCV: 98 fL — ABNORMAL HIGH (ref 79–97)
Monocytes Absolute: 0.4 10*3/uL (ref 0.1–0.9)
Monocytes: 8 %
Neutrophils Absolute: 3.5 10*3/uL (ref 1.4–7.0)
Neutrophils: 67 %
Platelets: 183 10*3/uL (ref 150–450)
RBC: 4.52 x10E6/uL (ref 3.77–5.28)
RDW: 12.1 % (ref 11.7–15.4)
WBC: 5.3 10*3/uL (ref 3.4–10.8)

## 2023-01-09 LAB — LIPID PANEL
Chol/HDL Ratio: 3.7 ratio (ref 0.0–4.4)
Cholesterol, Total: 222 mg/dL — ABNORMAL HIGH (ref 100–199)
HDL: 60 mg/dL (ref >39)
LDL Chol Calc (NIH): 133 mg/dL — ABNORMAL HIGH (ref 0–99)
Triglycerides: 167 mg/dL — ABNORMAL HIGH (ref 0–149)
VLDL Cholesterol Cal: 29 mg/dL (ref 5–40)

## 2023-01-09 LAB — CMP14+EGFR
ALT: 22 IU/L (ref 0–32)
AST: 28 IU/L (ref 0–40)
Albumin: 4.6 g/dL (ref 3.8–4.9)
Alkaline Phosphatase: 85 IU/L (ref 44–121)
BUN/Creatinine Ratio: 16 (ref 9–23)
BUN: 10 mg/dL (ref 6–24)
Bilirubin Total: 0.4 mg/dL (ref 0.0–1.2)
CO2: 19 mmol/L — ABNORMAL LOW (ref 20–29)
Calcium: 9.5 mg/dL (ref 8.7–10.2)
Chloride: 106 mmol/L (ref 96–106)
Creatinine, Ser: 0.62 mg/dL (ref 0.57–1.00)
Globulin, Total: 2.4 g/dL (ref 1.5–4.5)
Glucose: 86 mg/dL (ref 70–99)
Potassium: 4.1 mmol/L (ref 3.5–5.2)
Sodium: 142 mmol/L (ref 134–144)
Total Protein: 7 g/dL (ref 6.0–8.5)
eGFR: 105 mL/min/{1.73_m2} (ref >59)

## 2023-01-09 LAB — TSH: TSH: 1.14 u[IU]/mL (ref 0.450–4.500)

## 2023-01-09 LAB — VITAMIN D 25 HYDROXY (VIT D DEFICIENCY, FRACTURES): Vit D, 25-Hydroxy: 57.4 ng/mL (ref 30.0–100.0)

## 2023-01-18 ENCOUNTER — Encounter: Payer: Self-pay | Admitting: Family Medicine

## 2023-01-18 ENCOUNTER — Ambulatory Visit: Payer: Medicaid Other | Admitting: Family Medicine

## 2023-01-18 VITALS — BP 150/78 | HR 72 | Temp 98.3°F | Ht 63.0 in | Wt 162.2 lb

## 2023-01-18 DIAGNOSIS — S81012A Laceration without foreign body, left knee, initial encounter: Secondary | ICD-10-CM

## 2023-01-18 NOTE — Progress Notes (Unsigned)
Acute Office Visit  Subjective:     Patient ID: Maria West, female    DOB: [birth year], 57 y.o.   MRN: [id number]  Chief Complaint  Patient presents with   Laceration    Laceration    Patient is in today for fall yesterday evening, about 14 hours ago. She slipped on her porch and landed on her left knee. She now has a laceration on her knee. She isn't sure if she sustained the cut on a piece of wood or a nail.  This bleed for about 10 minutes last night. No significant bleeding today. Deneis fever or chills. Mild pain. She has applied neosporin. She is UTD of Tdap with last vaccine in 2022.     ROS As per HPI.      Objective:    BP (!) 150/78   Pulse 72   Temp 98.3 F (36.8 C) (Temporal)   Ht 5\' 3"  (1.6 m)   Wt 162 lb 4 oz (73.6 kg)   LMP 08/18/2009   SpO2 97%   BMI 28.74 kg/m  {Vitals History (Optional):23777}  Physical Exam Vitals and nursing note reviewed.  Constitutional:      General: She is not in acute distress.    Appearance: She is not ill-appearing, toxic-appearing or diaphoretic.  Pulmonary:     Effort: Pulmonary effort is normal. No respiratory distress.  Musculoskeletal:     Right lower leg: No edema.     Left lower leg: No edema.  Skin:    General: Skin is warm and dry.     Findings: Laceration (3 cm laceration to anterior left knee. No signs of infection, No active bleeding) present.  Neurological:     Mental Status: She is alert.      Laceration repair  Date/Time: 01/18/2023 9:52 AM  Performed by: Gabriel Earing, FNP Authorized by: Gabriel Earing, FNP   Consent:    Consent obtained:  Verbal   Consent given by:  Patient   Risks, benefits, and alternatives were discussed: yes     Risks discussed:  Infection, pain, need for additional repair and poor wound healing   Alternatives discussed:  Delayed treatment and observation Anesthesia:    Anesthesia method:  None Laceration details:    Location:  Leg   Leg location:  L  knee   Length (cm):  30   Depth (mm):  2 Pre-procedure details:    Preparation:  Patient was prepped and draped in usual sterile fashion Exploration:    Wound extent: no areolar tissue violation noted, no fascia violation noted, no foreign bodies/material noted, no muscle damage noted, no nerve damage noted, no tendon damage noted, no underlying fracture noted and no vascular damage noted     Contaminated: no   Treatment:    Area cleansed with:  Saline   Amount of cleaning:  Standard   Irrigation solution:  Sterile saline   Irrigation method:  Syringe   Debridement:  None   Undermining:  None   Scar revision: no     Layers/structures repaired:  Deep subcutaneous Skin repair:    Repair method:  Steri-Strips   Number of Steri-Strips:  3 Approximation:    Approximation:  Close Repair type:    Repair type:  Simple Post-procedure details:    Dressing:  Non-adherent dressing   Procedure completion:  Tolerated well, no immediate complications   No results found for any visits on 01/18/23.      Assessment & Plan:   Maria West  was seen today for laceration.  Diagnoses and all orders for this visit:  Laceration of left knee, initial encounter Laceration irrigated and closed with steri strips today. No current signs of infection.  -     Laceration repair     No orders of the defined types were placed in this encounter.   No follow-ups on file.  Gabriel Earing, FNP

## 2023-01-21 ENCOUNTER — Encounter: Payer: Self-pay | Admitting: Family Medicine

## 2023-04-01 ENCOUNTER — Other Ambulatory Visit: Payer: Self-pay | Admitting: Family

## 2023-04-01 DIAGNOSIS — E559 Vitamin D deficiency, unspecified: Secondary | ICD-10-CM

## 2023-10-14 ENCOUNTER — Other Ambulatory Visit: Payer: Self-pay | Admitting: Family

## 2023-10-14 DIAGNOSIS — E559 Vitamin D deficiency, unspecified: Secondary | ICD-10-CM

## 2024-01-19 ENCOUNTER — Other Ambulatory Visit: Payer: Self-pay | Admitting: Family

## 2024-01-19 DIAGNOSIS — F331 Major depressive disorder, recurrent, moderate: Secondary | ICD-10-CM

## 2024-01-19 DIAGNOSIS — F411 Generalized anxiety disorder: Secondary | ICD-10-CM

## 2024-01-20 MED ORDER — CITALOPRAM HYDROBROMIDE 40 MG PO TABS: ORAL_TABLET | ORAL | 0 refills | Status: DC

## 2024-01-20 NOTE — Telephone Encounter (Signed)
 Christy NTBS last OV 01/08/23 NO RF sent to pharmacy last OV greater than a year

## 2024-01-20 NOTE — Telephone Encounter (Signed)
 Pt scheduled appt in Oct. Pt doesn't have insurance now but she will in Oct. She wants to know if she can get enough meds for blood pressure and cholesterol to last until next appt

## 2024-01-20 NOTE — Addendum Note (Signed)
 Addended by: Chelli Yerkes D on: 01/20/2024 02:41 PM   Modules accepted: Orders

## 2024-02-01 ENCOUNTER — Other Ambulatory Visit: Payer: Self-pay | Admitting: Family

## 2024-02-01 DIAGNOSIS — I10 Essential (primary) hypertension: Secondary | ICD-10-CM

## 2024-02-01 DIAGNOSIS — E785 Hyperlipidemia, unspecified: Secondary | ICD-10-CM

## 2024-02-21 ENCOUNTER — Encounter: Payer: Self-pay | Admitting: Family

## 2024-02-21 ENCOUNTER — Ambulatory Visit (INDEPENDENT_AMBULATORY_CARE_PROVIDER_SITE_OTHER): Payer: PRIVATE HEALTH INSURANCE | Admitting: Family

## 2024-02-21 VITALS — BP 150/81 | HR 87 | Temp 98.8°F | Ht 63.0 in | Wt 164.0 lb

## 2024-02-21 DIAGNOSIS — E785 Hyperlipidemia, unspecified: Secondary | ICD-10-CM

## 2024-02-21 DIAGNOSIS — I10 Essential (primary) hypertension: Secondary | ICD-10-CM

## 2024-02-21 DIAGNOSIS — E559 Vitamin D deficiency, unspecified: Secondary | ICD-10-CM

## 2024-02-21 DIAGNOSIS — Z0001 Encounter for general adult medical examination with abnormal findings: Secondary | ICD-10-CM

## 2024-02-21 DIAGNOSIS — F331 Major depressive disorder, recurrent, moderate: Secondary | ICD-10-CM

## 2024-02-21 DIAGNOSIS — F17200 Nicotine dependence, unspecified, uncomplicated: Secondary | ICD-10-CM | POA: Diagnosis not present

## 2024-02-21 DIAGNOSIS — Z0000 Encounter for general adult medical examination without abnormal findings: Secondary | ICD-10-CM

## 2024-02-21 DIAGNOSIS — F411 Generalized anxiety disorder: Secondary | ICD-10-CM

## 2024-02-21 DIAGNOSIS — Z1159 Encounter for screening for other viral diseases: Secondary | ICD-10-CM

## 2024-02-21 MED ORDER — LOSARTAN POTASSIUM 100 MG PO TABS: 100.0000 mg | ORAL_TABLET | Freq: Every day | ORAL | 4 refills | Status: AC

## 2024-02-21 NOTE — Progress Notes (Addendum)
 Subjective:    Patient ID: Maria West, female    DOB: 1966/03/03, 58 y.o.   MRN: [id number]  Chief Complaint  Patient presents with   Medical Management of Chronic Issues   Pt presents to the office today for chronic follow up.  Hypertension This is a chronic problem. The current episode started more than 1 year ago. The problem has been waxing and waning since onset. The problem is uncontrolled. Associated symptoms include anxiety. Pertinent negatives include no malaise/fatigue, peripheral edema or shortness of breath. Risk factors for coronary artery disease include dyslipidemia, sedentary lifestyle and post-menopausal state. Past treatments include angiotensin blockers. The current treatment provides moderate improvement.  Hyperlipidemia This is a chronic problem. The current episode started more than 1 year ago. The problem is uncontrolled. Recent lipid tests were reviewed and are high. Pertinent negatives include no shortness of breath. Current antihyperlipidemic treatment includes statins. The current treatment provides moderate improvement of lipids. Risk factors for coronary artery disease include dyslipidemia, hypertension and a sedentary lifestyle.  Anxiety Presents for follow-up visit. Symptoms include excessive worry, nervous/anxious behavior and restlessness. Patient reports no shortness of breath. Symptoms occur occasionally. The severity of symptoms is moderate.    Depression        This is a chronic problem.  The current episode started more than 1 year ago.   The problem occurs intermittently.  Associated symptoms include fatigue and restlessness.  Associated symptoms include no helplessness, no hopelessness and not sad.  Past treatments include SSRIs - Selective serotonin reuptake inhibitors.  Past medical history includes anxiety.   Nicotine Dependence Presents for follow-up visit. Symptoms include fatigue. The symptoms have been stable. She smokes 1 pack of cigarettes  per day.      Review of Systems  Constitutional:  Positive for fatigue. Negative for malaise/fatigue.  Respiratory:  Negative for shortness of breath.   Psychiatric/Behavioral:  The patient is nervous/anxious.   All other systems reviewed and are negative.  Family History  Problem Relation Age of Onset   Diabetes Mother    Colon cancer Neg Hx    Social History   Socioeconomic History   Marital status: Single    Spouse name: Not on file   Number of children: Not on file   Years of education: Not on file   Highest education level: Not on file  Occupational History   Not on file  Tobacco Use   Smoking status: Every Day    Current packs/day: 1.00    Average packs/day: 1 pack/day for 39.8 years (39.8 ttl pk-yrs)    Types: Cigarettes    Start date: 04/23/1984   Smokeless tobacco: Never  Substance and Sexual Activity   Alcohol use: Yes    Comment: 2-3 shots whisky per night   Drug use: No   Sexual activity: Not on file  Other Topics Concern   Not on file  Social History Narrative   Not on file   Social Drivers of Health   Financial Resource Strain: Not on file  Food Insecurity: No Food Insecurity (02/21/2024)   Hunger Vital Sign    Worried About Running Out of Food in the Last Year: Never true    Ran Out of Food in the Last Year: Never true  Transportation Needs: Unknown (02/21/2024)   PRAPARE - Administrator, Civil Service (Medical): No    Lack of Transportation (Non-Medical): Not on file  Physical Activity: Not on file  Stress: Not on file  Social  Connections: Unknown (09/04/2021)   [facility]   Social Network    Social Network: Not on file        Objective:   Physical Exam Vitals reviewed.  Constitutional:      General: She is not in acute distress.    Appearance: She is well-developed.  HENT:     Head: Normocephalic and atraumatic.     Right Ear: Tympanic membrane normal.     Left Ear: Tympanic membrane normal.  Eyes:      Pupils: Pupils are equal, round, and reactive to light.  Neck:     Thyroid : No thyromegaly.  Cardiovascular:     Rate and Rhythm: Normal rate and regular rhythm.     Heart sounds: Normal heart sounds. No murmur heard. Pulmonary:     Effort: Pulmonary effort is normal. No respiratory distress.     Breath sounds: Normal breath sounds. No wheezing.  Abdominal:     General: Bowel sounds are normal. There is no distension.     Palpations: Abdomen is soft.     Tenderness: There is no abdominal tenderness.  Musculoskeletal:        General: No tenderness. Normal range of motion.     Cervical back: Normal range of motion and neck supple.  Skin:    General: Skin is warm and dry.  Neurological:     Mental Status: She is alert and oriented to person, place, and time.     Cranial Nerves: No cranial nerve deficit.     Deep Tendon Reflexes: Reflexes are normal and symmetric.  Psychiatric:        Behavior: Behavior normal.        Thought Content: Thought content normal.        Judgment: Judgment normal.       BP (!) 150/81   Pulse 87   Temp 98.8 F (37.1 C)   Ht 5' 3 (1.6 m)   Wt 164 lb (74.4 kg)   LMP 08/18/2009   SpO2 96%   BMI 29.05 kg/m      Assessment & Plan:  Illiana Losurdo comes in today with chief complaint of Medical Management of Chronic Issues   Diagnosis and orders addressed:  2. Essential hypertension -Will increase Losartan  to 100 mg from 50 mg  -Daily blood pressure log given with instructions on how to fill out and told to bring to next visit -Dash diet information given -Exercise encouraged - Stress Management  -Continue current meds - losartan  (COZAAR ) 100 MG tablet; Take 1 tablet (100 mg total) by mouth daily.  Dispense: 90 tablet; Refill: 4 - CMP14+EGFR - CBC with Differential/Platelet - TSH  3. Vitamin D  deficiency - CMP14+EGFR - CBC with Differential/Platelet - VITAMIN D  25 Hydroxy (Vit-D Deficiency, Fractures)  4. Hyperlipidemia,  unspecified hyperlipidemia type - CMP14+EGFR - CBC with Differential/Platelet - Lipid panel - TSH  5. Current smoker - CMP14+EGFR - CBC with Differential/Platelet  6. Moderate episode of recurrent major depressive disorder (HCC) - CMP14+EGFR - CBC with Differential/Platelet  7. GAD (generalized anxiety disorder) - CMP14+EGFR - CBC with Differential/Platelet  8. Need for hepatitis B screening test - CMP14+EGFR - Hepatitis B surface antibody,quantitative   Labs pending Will increase losartan  to 100 mg from 50 mg -Daily blood pressure log given with instructions on how to fill out and told to bring to next visit -Dash diet information given -Exercise encouraged - Stress Management  -Continue current meds Health Maintenance reviewed Diet and exercise encouraged  Follow up plan: 2 weeks for HTN  Bari Learn, FNP

## 2024-02-21 NOTE — Patient Instructions (Signed)
 Hypertension, Adult High blood pressure (hypertension) is when the force of blood pumping through the arteries is too strong. The arteries are the blood vessels that carry blood from the heart throughout the body. Hypertension forces the heart to work harder to pump blood and may cause arteries to become narrow or stiff. Untreated or uncontrolled hypertension can lead to a heart attack, heart failure, a stroke, kidney disease, and other problems. A blood pressure reading consists of a higher number over a lower number. Ideally, your blood pressure should be below 120/80. The first ("top") number is called the systolic pressure. It is a measure of the pressure in your arteries as your heart beats. The second ("bottom") number is called the diastolic pressure. It is a measure of the pressure in your arteries as the heart relaxes. What are the causes? The exact cause of this condition is not known. There are some conditions that result in high blood pressure. What increases the risk? Certain factors may make you more likely to develop high blood pressure. Some of these risk factors are under your control, including: Smoking. Not getting enough exercise or physical activity. Being overweight. Having too much fat, sugar, calories, or salt (sodium) in your diet. Drinking too much alcohol. Other risk factors include: Having a personal history of heart disease, diabetes, high cholesterol, or kidney disease. Stress. Having a family history of high blood pressure and high cholesterol. Having obstructive sleep apnea. Age. The risk increases with age. What are the signs or symptoms? High blood pressure may not cause symptoms. Very high blood pressure (hypertensive crisis) may cause: Headache. Fast or irregular heartbeats (palpitations). Shortness of breath. Nosebleed. Nausea and vomiting. Vision changes. Severe chest pain, dizziness, and seizures. How is this diagnosed? This condition is diagnosed by  measuring your blood pressure while you are seated, with your arm resting on a flat surface, your legs uncrossed, and your feet flat on the floor. The cuff of the blood pressure monitor will be placed directly against the skin of your upper arm at the level of your heart. Blood pressure should be measured at least twice using the same arm. Certain conditions can cause a difference in blood pressure between your right and left arms. If you have a high blood pressure reading during one visit or you have normal blood pressure with other risk factors, you may be asked to: Return on a different day to have your blood pressure checked again. Monitor your blood pressure at home for 1 week or longer. If you are diagnosed with hypertension, you may have other blood or imaging tests to help your health care provider understand your overall risk for other conditions. How is this treated? This condition is treated by making healthy lifestyle changes, such as eating healthy foods, exercising more, and reducing your alcohol intake. You may be referred for counseling on a healthy diet and physical activity. Your health care provider may prescribe medicine if lifestyle changes are not enough to get your blood pressure under control and if: Your systolic blood pressure is above 130. Your diastolic blood pressure is above 80. Your personal target blood pressure may vary depending on your medical conditions, your age, and other factors. Follow these instructions at home: Eating and drinking  Eat a diet that is high in fiber and potassium, and low in sodium, added sugar, and fat. An example of this eating plan is called the DASH diet. DASH stands for Dietary Approaches to Stop Hypertension. To eat this way: Eat  plenty of fresh fruits and vegetables. Try to fill one half of your plate at each meal with fruits and vegetables. Eat whole grains, such as whole-wheat pasta, brown rice, or whole-grain bread. Fill about one  fourth of your plate with whole grains. Eat or drink low-fat dairy products, such as skim milk or low-fat yogurt. Avoid fatty cuts of meat, processed or cured meats, and poultry with skin. Fill about one fourth of your plate with lean proteins, such as fish, chicken without skin, beans, eggs, or tofu. Avoid pre-made and processed foods. These tend to be higher in sodium, added sugar, and fat. Reduce your daily sodium intake. Many people with hypertension should eat less than 1,500 mg of sodium a day. Do not drink alcohol if: Your health care provider tells you not to drink. You are pregnant, may be pregnant, or are planning to become pregnant. If you drink alcohol: Limit how much you have to: 0-1 drink a day for women. 0-2 drinks a day for men. Know how much alcohol is in your drink. In the [place] one drink equals one 12 oz bottle of beer (355 mL), one 5 oz glass of wine (148 mL), or one 1 oz glass of hard liquor (44 mL). Lifestyle  Work with your health care provider to maintain a healthy body weight or to lose weight. Ask what an ideal weight is for you. Get at least 30 minutes of exercise that causes your heart to beat faster (aerobic exercise) most days of the week. Activities may include walking, swimming, or biking. Include exercise to strengthen your muscles (resistance exercise), such as Pilates or lifting weights, as part of your weekly exercise routine. Try to do these types of exercises for 30 minutes at least 3 days a week. Do not use any products that contain nicotine or tobacco. These products include cigarettes, chewing tobacco, and vaping devices, such as e-cigarettes. If you need help quitting, ask your health care provider. Monitor your blood pressure at home as told by your health care provider. Keep all follow-up visits. This is important. Medicines Take over-the-counter and prescription medicines only as told by your health care provider. Follow directions carefully. Blood  pressure medicines must be taken as prescribed. Do not skip doses of blood pressure medicine. Doing this puts you at risk for problems and can make the medicine less effective. Ask your health care provider about side effects or reactions to medicines that you should watch for. Contact a health care provider if you: Think you are having a reaction to a medicine you are taking. Have headaches that keep coming back (recurring). Feel dizzy. Have swelling in your ankles. Have trouble with your vision. Get help right away if you: Develop a severe headache or confusion. Have unusual weakness or numbness. Feel faint. Have severe pain in your chest or abdomen. Vomit repeatedly. Have trouble breathing. These symptoms may be an emergency. Get help right away. Call 911. Do not wait to see if the symptoms will go away. Do not drive yourself to the hospital. Summary Hypertension is when the force of blood pumping through your arteries is too strong. If this condition is not controlled, it may put you at risk for serious complications. Your personal target blood pressure may vary depending on your medical conditions, your age, and other factors. For most people, a normal blood pressure is less than 120/80. Hypertension is treated with lifestyle changes, medicines, or a combination of both. Lifestyle changes include losing weight, eating a healthy,  low-sodium diet, exercising more, and limiting alcohol. This information is not intended to replace advice given to you by your health care provider. Make sure you discuss any questions you have with your health care provider. Document Revised: 02/14/2021 Document Reviewed: 02/14/2021 Elsevier Patient Education  2024 [employer].

## 2024-02-22 LAB — CBC WITH DIFFERENTIAL/PLATELET
Basophils Absolute: 0.1 x10E3/uL (ref 0.0–0.2)
Basos: 1 %
EOS (ABSOLUTE): 0.1 x10E3/uL (ref 0.0–0.4)
Eos: 2 %
Hematocrit: 47.1 % — ABNORMAL HIGH (ref 34.0–46.6)
Hemoglobin: 15.6 g/dL (ref 11.1–15.9)
Immature Grans (Abs): 0 x10E3/uL (ref 0.0–0.1)
Immature Granulocytes: 0 %
Lymphocytes Absolute: 1.5 x10E3/uL (ref 0.7–3.1)
Lymphs: 29 %
MCH: 32.4 pg (ref 26.6–33.0)
MCHC: 33.1 g/dL (ref 31.5–35.7)
MCV: 98 fL — ABNORMAL HIGH (ref 79–97)
Monocytes Absolute: 0.5 x10E3/uL (ref 0.1–0.9)
Monocytes: 11 %
Neutrophils Absolute: 2.9 x10E3/uL (ref 1.4–7.0)
Neutrophils: 57 %
Platelets: 185 x10E3/uL (ref 150–450)
RBC: 4.81 x10E6/uL (ref 3.77–5.28)
RDW: 12.2 % (ref 11.7–15.4)
WBC: 5 x10E3/uL (ref 3.4–10.8)

## 2024-02-22 LAB — CMP14+EGFR
ALT: 28 IU/L (ref 0–32)
AST: 33 IU/L (ref 0–40)
Albumin: 4.7 g/dL (ref 3.8–4.9)
Alkaline Phosphatase: 78 IU/L (ref 49–135)
BUN/Creatinine Ratio: 18 (ref 9–23)
BUN: 13 mg/dL (ref 6–24)
Bilirubin Total: 0.6 mg/dL (ref 0.0–1.2)
CO2: 23 mmol/L (ref 20–29)
Calcium: 9.8 mg/dL (ref 8.7–10.2)
Chloride: 103 mmol/L (ref 96–106)
Creatinine, Ser: 0.72 mg/dL (ref 0.57–1.00)
Globulin, Total: 2.3 g/dL (ref 1.5–4.5)
Glucose: 100 mg/dL — ABNORMAL HIGH (ref 70–99)
Potassium: 4.4 mmol/L (ref 3.5–5.2)
Sodium: 139 mmol/L (ref 134–144)
Total Protein: 7 g/dL (ref 6.0–8.5)
eGFR: 98 mL/min/1.73 (ref >59)

## 2024-02-22 LAB — HEPATITIS B SURFACE ANTIBODY, QUANTITATIVE: Hepatitis B Surf Ab Quant: <3.5 m[IU]/mL — AB

## 2024-02-22 LAB — LIPID PANEL
Chol/HDL Ratio: 3.8 ratio (ref 0.0–4.4)
Cholesterol, Total: 233 mg/dL — ABNORMAL HIGH (ref 100–199)
HDL: 62 mg/dL (ref >39)
LDL Chol Calc (NIH): 155 mg/dL — ABNORMAL HIGH (ref 0–99)
Triglycerides: 90 mg/dL (ref 0–149)
VLDL Cholesterol Cal: 16 mg/dL (ref 5–40)

## 2024-02-22 LAB — TSH: TSH: 1.32 u[IU]/mL (ref 0.450–4.500)

## 2024-02-22 LAB — VITAMIN D 25 HYDROXY (VIT D DEFICIENCY, FRACTURES): Vit D, 25-Hydroxy: 48.1 ng/mL (ref 30.0–100.0)

## 2024-02-24 ENCOUNTER — Ambulatory Visit: Payer: Self-pay | Admitting: Family

## 2024-02-24 MED ORDER — EZETIMIBE 10 MG PO TABS: 10.0000 mg | ORAL_TABLET | Freq: Every day | ORAL | 3 refills | Status: AC

## 2024-02-25 NOTE — Addendum Note (Signed)
 Addended by: LAVELL LYE A on: 02/25/2024 12:55 PM   Modules accepted: Level of Service

## 2024-03-23 ENCOUNTER — Ambulatory Visit: Payer: PRIVATE HEALTH INSURANCE | Admitting: Family

## 2024-03-23 ENCOUNTER — Encounter: Payer: Self-pay | Admitting: Family

## 2024-03-23 VITALS — BP 132/74 | HR 70 | Temp 98.0°F | Ht 63.0 in | Wt 164.4 lb

## 2024-03-23 DIAGNOSIS — E785 Hyperlipidemia, unspecified: Secondary | ICD-10-CM

## 2024-03-23 DIAGNOSIS — Z0000 Encounter for general adult medical examination without abnormal findings: Secondary | ICD-10-CM

## 2024-03-23 DIAGNOSIS — Z0001 Encounter for general adult medical examination with abnormal findings: Secondary | ICD-10-CM

## 2024-03-23 DIAGNOSIS — F331 Major depressive disorder, recurrent, moderate: Secondary | ICD-10-CM

## 2024-03-23 DIAGNOSIS — E559 Vitamin D deficiency, unspecified: Secondary | ICD-10-CM

## 2024-03-23 DIAGNOSIS — F411 Generalized anxiety disorder: Secondary | ICD-10-CM

## 2024-03-23 DIAGNOSIS — F17200 Nicotine dependence, unspecified, uncomplicated: Secondary | ICD-10-CM

## 2024-03-23 DIAGNOSIS — I10 Essential (primary) hypertension: Secondary | ICD-10-CM

## 2024-03-23 MED ORDER — CITALOPRAM HYDROBROMIDE 40 MG PO TABS: ORAL_TABLET | ORAL | 0 refills | Status: AC

## 2024-03-23 NOTE — Progress Notes (Signed)
 Subjective:    Patient ID: Maria West, female    DOB: 11/12/1965, 58 y.o.   MRN: [id number]  Chief Complaint  Patient presents with   Hypertension   Pt presents to the office today for CPE and follow up on HTN. She was seenon 02/21/24 and we increased her losartan  to 100 mg from 50 mg. Her BP is at goal today!  Hypertension This is a chronic problem. The current episode started more than 1 year ago. The problem has been resolved since onset. The problem is controlled. Associated symptoms include anxiety. Pertinent negatives include no malaise/fatigue, peripheral edema or shortness of breath. Risk factors for coronary artery disease include dyslipidemia, sedentary lifestyle and post-menopausal state. Past treatments include angiotensin blockers. The current treatment provides moderate improvement.  Hyperlipidemia This is a chronic problem. The current episode started more than 1 year ago. The problem is uncontrolled. Recent lipid tests were reviewed and are high. Pertinent negatives include no shortness of breath. Current antihyperlipidemic treatment includes statins. The current treatment provides mild improvement of lipids. Risk factors for coronary artery disease include dyslipidemia, hypertension and a sedentary lifestyle.  Anxiety Presents for follow-up visit. Symptoms include excessive worry, nervous/anxious behavior and restlessness. Patient reports no shortness of breath. Symptoms occur occasionally. The severity of symptoms is moderate.    Depression        This is a chronic problem.  The current episode started more than 1 year ago.   The problem occurs intermittently.  Associated symptoms include fatigue and restlessness.  Associated symptoms include no helplessness, no hopelessness and not sad.  Past treatments include SSRIs - Selective serotonin reuptake inhibitors.  Past medical history includes anxiety.   Nicotine Dependence Presents for follow-up visit. Symptoms include  fatigue. The symptoms have been stable. She smokes 1 pack of cigarettes per day.      Review of Systems  Constitutional:  Positive for fatigue. Negative for malaise/fatigue.  Respiratory:  Negative for shortness of breath.   Psychiatric/Behavioral:  The patient is nervous/anxious.   All other systems reviewed and are negative.  Family History  Problem Relation Age of Onset   Diabetes Mother    Colon cancer Neg Hx    Social History   Socioeconomic History   Marital status: Single    Spouse name: Not on file   Number of children: Not on file   Years of education: Not on file   Highest education level: Not on file  Occupational History   Not on file  Tobacco Use   Smoking status: Every Day    Current packs/day: 1.00    Average packs/day: 1 pack/day for 39.9 years (39.9 ttl pk-yrs)    Types: Cigarettes    Start date: 04/23/1984   Smokeless tobacco: Never  Substance and Sexual Activity   Alcohol use: Yes    Comment: 2-3 shots whisky per night   Drug use: No   Sexual activity: Not on file  Other Topics Concern   Not on file  Social History Narrative   Not on file   Social Drivers of Health   Financial Resource Strain: Not on file  Food Insecurity: No Food Insecurity (02/21/2024)   Hunger Vital Sign    Worried About Running Out of Food in the Last Year: Never true    Ran Out of Food in the Last Year: Never true  Transportation Needs: Unknown (02/21/2024)   PRAPARE - Transportation    Lack of Transportation (Medical): No    Lack of  Transportation (Non-Medical): Not on file  Physical Activity: Not on file  Stress: Not on file  Social Connections: Unknown (09/04/2021)   Received from [facility]   Social Network    Social Network: Not on file        Objective:   Physical Exam Vitals reviewed.  Constitutional:      General: She is not in acute distress.    Appearance: She is well-developed.  HENT:     Head: Normocephalic and atraumatic.     Right Ear:  Tympanic membrane normal.     Left Ear: Tympanic membrane normal.  Eyes:     Pupils: Pupils are equal, round, and reactive to light.  Neck:     Thyroid : No thyromegaly.  Cardiovascular:     Rate and Rhythm: Normal rate and regular rhythm.     Heart sounds: Normal heart sounds. No murmur heard. Pulmonary:     Effort: Pulmonary effort is normal. No respiratory distress.     Breath sounds: Normal breath sounds. No wheezing.  Abdominal:     General: Bowel sounds are normal. There is no distension.     Palpations: Abdomen is soft.     Tenderness: There is no abdominal tenderness.  Musculoskeletal:        General: No tenderness. Normal range of motion.     Cervical back: Normal range of motion and neck supple.  Skin:    General: Skin is warm and dry.  Neurological:     Mental Status: She is alert and oriented to person, place, and time.     Cranial Nerves: No cranial nerve deficit.     Deep Tendon Reflexes: Reflexes are normal and symmetric.  Psychiatric:        Behavior: Behavior normal.        Thought Content: Thought content normal.        Judgment: Judgment normal.       BP 132/74   Pulse 70   Temp 98 F (36.7 C) (Temporal)   Ht 5' 3 (1.6 m)   Wt 164 lb 6.4 oz (74.6 kg)   LMP 08/18/2009   SpO2 97%   BMI 29.12 kg/m      Assessment & Plan:  [place] comes in today with chief complaint of Hypertension   Diagnosis and orders addressed:  1. Annual physical exam (Primary) - CMP14+EGFR  2. Moderate episode of recurrent major depressive disorder (HCC) - citalopram  (CELEXA ) 40 MG tablet; TAKE 1 TABLET BY MOUTH ONCE DAILY  Dispense: 30 tablet; Refill: 0 - CMP14+EGFR  3. Vitamin D  deficiency - CMP14+EGFR  4. Hyperlipidemia, unspecified hyperlipidemia type - CMP14+EGFR  5. GAD (generalized anxiety disorder)  - citalopram  (CELEXA ) 40 MG tablet; TAKE 1 TABLET BY MOUTH ONCE DAILY  Dispense: 30 tablet; Refill: 0 - CMP14+EGFR  6. Essential hypertension -  CMP14+EGFR  7. Current smoker - CMP14+EGFR   Labs pending BP at goal! -Continue current meds Health Maintenance reviewed Diet and exercise encouraged  Follow up plan:   6 months   Bari Learn, FNP

## 2024-03-23 NOTE — Patient Instructions (Signed)
 Lung Cancer Screening A lung cancer screening is a test that checks for lung cancer when there are no symptoms or history of that disease. The screening is done to look for lung cancer in its very early stages. Finding cancer early improves the chances of successful treatment. It may save your life. Who should have a screening? You should be screened for lung cancer if all of these apply: You currently smoke or you used to smoke. You are between the ages of 51 and 31 years old. Screening may be recommended up to age 58 depending on your overall health and other factors. You have a smoking history of 1 pack of cigarettes a day for 20 years or 2 packs a day for 10 years. How is screening done?  The recommended screening test is a low-dose computed tomography (LDCT) scan. This scan takes detailed images of the lungs. This allows a health care provider to look for abnormal cells. If you are at risk for lung cancer, it is recommended that you get screened once a year. Talk to your health care provider about the risks, benefits, and limitations of screening. What are the benefits of screening? Screening can find lung cancer early, before symptoms start and before it has spread outside of the lungs. The chances of curing lung cancer are greater if the cancer is diagnosed early. What are the risks of screening? The screening may show lung cancer when no cancer is present. Talk with your health care provider about what your results mean. In some cases, your health care provider may do more testing to confirm the results. The screening may not find lung cancer when it is present. You will be exposed to radiation from repeated LDCT tests, which can cause cancer in otherwise healthy people. How can I lower my risk of lung cancer? Make these lifestyle changes to lower your risk of developing lung cancer: Do not use any products that contain nicotine or tobacco. These products include cigarettes, chewing  tobacco, and vaping devices, such as e-cigarettes. If you need help quitting, ask your health care provider. Avoid secondhand smoke. Avoid exposure to radiation. Avoid exposure to radon gas. Have your home checked for radon regularly. Avoid things that cause cancer (carcinogens). Avoid living or working in places with high air pollution or diesel exhaust. Questions to ask your health care provider Am I eligible for lung cancer screening? Does my health insurance cover the cost of lung cancer screening? What happens if the lung cancer screening shows something of concern? How soon will I have results from my lung cancer screening? Is there anything that I need to do to prepare for my lung cancer screening? What happens if I decide not to have lung cancer screening? Where to find more information Ask your health care provider about the risks and benefits of screening. More information and resources are available from these organizations: American Cancer Society (ACS): cancer.org American Lung Association: lung.org National Cancer Institute: cancer.gov Contact a health care provider if: You start to show symptoms of lung cancer, including: A cough that will not go away. High-pitched whistling sounds when you breathe, most often when you breathe out (wheezing). Chest pain. Coughing up blood. Shortness of breath. Weight loss that cannot be explained. Constant tiredness (fatigue). Hoarse voice. Summary Lung cancer screening may find lung cancer before symptoms appear. Finding cancer early improves the chances of successful treatment. It may save your life. The recommended screening test is a low-dose computed tomography (LDCT) scan that  looks for abnormal cells in the lungs. If you are at risk for lung cancer, it is recommended that you get screened once a year. You can make lifestyle changes to lower your risk of lung cancer. Ask your health care provider about the risks and benefits of  screening. This information is not intended to replace advice given to you by your health care provider. Make sure you discuss any questions you have with your health care provider. Document Revised: 04/17/2022 Document Reviewed: 09/28/2020 Elsevier Patient Education  2024 [employer].

## 2024-03-24 ENCOUNTER — Ambulatory Visit: Payer: Self-pay | Admitting: Family

## 2024-03-24 LAB — CMP14+EGFR
ALT: 27 IU/L (ref 0–32)
AST: 36 IU/L (ref 0–40)
Albumin: 4.5 g/dL (ref 3.8–4.9)
Alkaline Phosphatase: 76 IU/L (ref 49–135)
BUN/Creatinine Ratio: 15 (ref 9–23)
BUN: 10 mg/dL (ref 6–24)
Bilirubin Total: 0.3 mg/dL (ref 0.0–1.2)
CO2: 19 mmol/L — ABNORMAL LOW (ref 20–29)
Calcium: 9.5 mg/dL (ref 8.7–10.2)
Chloride: 101 mmol/L (ref 96–106)
Creatinine, Ser: 0.66 mg/dL (ref 0.57–1.00)
Globulin, Total: 2.3 g/dL (ref 1.5–4.5)
Glucose: 77 mg/dL (ref 70–99)
Potassium: 4 mmol/L (ref 3.5–5.2)
Sodium: 140 mmol/L (ref 134–144)
Total Protein: 6.8 g/dL (ref 6.0–8.5)
eGFR: 103 mL/min/1.73 (ref >59)

## 2024-04-05 ENCOUNTER — Other Ambulatory Visit: Payer: Self-pay | Admitting: Family

## 2024-04-05 DIAGNOSIS — F411 Generalized anxiety disorder: Secondary | ICD-10-CM

## 2024-04-06 ENCOUNTER — Other Ambulatory Visit: Payer: Self-pay | Admitting: Family

## 2024-04-06 DIAGNOSIS — E559 Vitamin D deficiency, unspecified: Secondary | ICD-10-CM

## 2024-04-24 ENCOUNTER — Other Ambulatory Visit: Payer: Self-pay | Admitting: Family

## 2024-04-24 DIAGNOSIS — Z1231 Encounter for screening mammogram for malignant neoplasm of breast: Secondary | ICD-10-CM

## 2024-04-27 ENCOUNTER — Ambulatory Visit
Admission: RE | Admit: 2024-04-27 | Discharge: 2024-04-27 | Disposition: A | Discharge status: Home / Self Care | Payer: PRIVATE HEALTH INSURANCE | Source: Ambulatory Visit | Attending: Family | Admitting: Family

## 2024-04-27 DIAGNOSIS — Z1231 Encounter for screening mammogram for malignant neoplasm of breast: Secondary | ICD-10-CM

## 2024-05-01 ENCOUNTER — Other Ambulatory Visit: Payer: Self-pay | Admitting: Family

## 2024-05-01 DIAGNOSIS — E785 Hyperlipidemia, unspecified: Secondary | ICD-10-CM

## 2024-09-22 ENCOUNTER — Ambulatory Visit: Payer: PRIVATE HEALTH INSURANCE | Admitting: Family
